# Patient Record
Sex: Female | Born: 1980 | Race: White | Hispanic: No | Marital: Married | Smoking: Current every day smoker
Health system: Southern US, Community
[De-identification: ages and names within clinical notes are randomized; demographics above are authoritative.]

## PROBLEM LIST (undated history)

## (undated) DIAGNOSIS — F4310 Post-traumatic stress disorder, unspecified: Secondary | ICD-10-CM

## (undated) DIAGNOSIS — M542 Cervicalgia: Secondary | ICD-10-CM

## (undated) DIAGNOSIS — R000 Tachycardia, unspecified: Secondary | ICD-10-CM

## (undated) DIAGNOSIS — I10 Essential (primary) hypertension: Secondary | ICD-10-CM

## (undated) DIAGNOSIS — K589 Irritable bowel syndrome without diarrhea: Secondary | ICD-10-CM

## (undated) DIAGNOSIS — F329 Major depressive disorder, single episode, unspecified: Secondary | ICD-10-CM

## (undated) DIAGNOSIS — G43909 Migraine, unspecified, not intractable, without status migrainosus: Secondary | ICD-10-CM

## (undated) DIAGNOSIS — K219 Gastro-esophageal reflux disease without esophagitis: Secondary | ICD-10-CM

## (undated) DIAGNOSIS — L309 Dermatitis, unspecified: Secondary | ICD-10-CM

## (undated) DIAGNOSIS — J020 Streptococcal pharyngitis: Secondary | ICD-10-CM

## (undated) DIAGNOSIS — Z889 Allergy status to unspecified drugs, medicaments and biological substances status: Secondary | ICD-10-CM

## (undated) DIAGNOSIS — R112 Nausea with vomiting, unspecified: Secondary | ICD-10-CM

## (undated) DIAGNOSIS — F419 Anxiety disorder, unspecified: Secondary | ICD-10-CM

## (undated) DIAGNOSIS — Z9889 Other specified postprocedural states: Secondary | ICD-10-CM

## (undated) DIAGNOSIS — F32A Depression, unspecified: Secondary | ICD-10-CM

## (undated) DIAGNOSIS — G932 Benign intracranial hypertension: Secondary | ICD-10-CM

## (undated) DIAGNOSIS — M1990 Unspecified osteoarthritis, unspecified site: Secondary | ICD-10-CM

## (undated) DIAGNOSIS — J329 Chronic sinusitis, unspecified: Secondary | ICD-10-CM

## (undated) DIAGNOSIS — R51 Headache: Secondary | ICD-10-CM

## (undated) DIAGNOSIS — I499 Cardiac arrhythmia, unspecified: Secondary | ICD-10-CM

## (undated) HISTORY — DX: Gastro-esophageal reflux disease without esophagitis: K21.9

## (undated) HISTORY — DX: Major depressive disorder, single episode, unspecified: F32.9

## (undated) HISTORY — DX: Post-traumatic stress disorder, unspecified: F43.10

## (undated) HISTORY — DX: Dermatitis, unspecified: L30.9

## (undated) HISTORY — DX: Migraine, unspecified, not intractable, without status migrainosus: G43.909

## (undated) HISTORY — DX: Irritable bowel syndrome without diarrhea: K58.9

## (undated) HISTORY — DX: Cervicalgia: M54.2

## (undated) HISTORY — DX: Essential (primary) hypertension: I10

## (undated) HISTORY — DX: Tachycardia, unspecified: R00.0

## (undated) HISTORY — PX: CHOLECYSTECTOMY: SHX55

## (undated) HISTORY — DX: Irritable bowel syndrome, unspecified: K58.9

## (undated) HISTORY — PX: WISDOM TOOTH EXTRACTION: SHX21

## (undated) HISTORY — PX: NO PAST SURGERIES: SHX2092

---
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
## 1999-07-26 ENCOUNTER — Inpatient Hospital Stay (HOSPITAL_COMMUNITY): Admission: AD | Admit: 1999-07-26 | Discharge: 1999-07-27 | Payer: Self-pay | Admitting: *Deleted

## 1999-07-26 ENCOUNTER — Encounter (INDEPENDENT_AMBULATORY_CARE_PROVIDER_SITE_OTHER): Payer: Self-pay | Admitting: Specialist

## 1999-07-26 ENCOUNTER — Encounter: Payer: Self-pay | Admitting: *Deleted

## 1999-07-27 HISTORY — PX: OTHER SURGICAL HISTORY: SHX169

## 1999-08-12 ENCOUNTER — Other Ambulatory Visit: Admission: RE | Admit: 1999-08-12 | Discharge: 1999-08-12 | Payer: Self-pay | Admitting: Obstetrics and Gynecology

## 2001-05-12 ENCOUNTER — Other Ambulatory Visit: Admission: RE | Admit: 2001-05-12 | Discharge: 2001-05-12 | Payer: Self-pay | Admitting: Obstetrics and Gynecology

## 2002-08-16 ENCOUNTER — Other Ambulatory Visit: Admission: RE | Admit: 2002-08-16 | Discharge: 2002-08-16 | Payer: Self-pay | Admitting: Obstetrics and Gynecology

## 2003-01-27 ENCOUNTER — Emergency Department (HOSPITAL_COMMUNITY): Admission: EM | Admit: 2003-01-27 | Discharge: 2003-01-27 | Payer: Self-pay | Admitting: Emergency Medicine

## 2003-01-30 ENCOUNTER — Ambulatory Visit (HOSPITAL_COMMUNITY): Admission: RE | Admit: 2003-01-30 | Discharge: 2003-01-30 | Payer: Self-pay | Admitting: Emergency Medicine

## 2003-01-30 ENCOUNTER — Encounter: Payer: Self-pay | Admitting: Emergency Medicine

## 2003-01-31 HISTORY — PX: CHOLECYSTECTOMY: SHX55

## 2003-02-27 ENCOUNTER — Encounter (INDEPENDENT_AMBULATORY_CARE_PROVIDER_SITE_OTHER): Payer: Self-pay | Admitting: *Deleted

## 2003-02-27 ENCOUNTER — Encounter: Payer: Self-pay | Admitting: General Surgery

## 2003-02-27 ENCOUNTER — Ambulatory Visit (HOSPITAL_COMMUNITY): Admission: RE | Admit: 2003-02-27 | Discharge: 2003-02-28 | Payer: Self-pay | Admitting: General Surgery

## 2003-03-01 ENCOUNTER — Encounter: Payer: Self-pay | Admitting: General Surgery

## 2003-03-01 ENCOUNTER — Inpatient Hospital Stay (HOSPITAL_COMMUNITY): Admission: EM | Admit: 2003-03-01 | Discharge: 2003-03-06 | Payer: Self-pay | Admitting: *Deleted

## 2003-03-04 ENCOUNTER — Encounter: Payer: Self-pay | Admitting: General Surgery

## 2003-09-18 ENCOUNTER — Other Ambulatory Visit: Admission: RE | Admit: 2003-09-18 | Discharge: 2003-09-18 | Payer: Self-pay | Admitting: Obstetrics and Gynecology

## 2004-05-14 ENCOUNTER — Ambulatory Visit (HOSPITAL_COMMUNITY): Admission: RE | Admit: 2004-05-14 | Discharge: 2004-05-14 | Payer: Self-pay | Admitting: Family Medicine

## 2004-05-14 ENCOUNTER — Emergency Department (HOSPITAL_COMMUNITY): Admission: EM | Admit: 2004-05-14 | Discharge: 2004-05-14 | Payer: Self-pay | Admitting: Family Medicine

## 2004-06-09 ENCOUNTER — Encounter: Admission: RE | Admit: 2004-06-09 | Discharge: 2004-06-09 | Payer: Self-pay | Admitting: Gastroenterology

## 2004-11-16 ENCOUNTER — Emergency Department (HOSPITAL_COMMUNITY): Admission: EM | Admit: 2004-11-16 | Discharge: 2004-11-16 | Payer: Self-pay | Admitting: Family Medicine

## 2005-01-24 ENCOUNTER — Encounter: Admission: RE | Admit: 2005-01-24 | Discharge: 2005-01-24 | Payer: Self-pay | Admitting: Neurology

## 2006-10-12 ENCOUNTER — Emergency Department (HOSPITAL_COMMUNITY): Admission: EM | Admit: 2006-10-12 | Discharge: 2006-10-12 | Payer: Self-pay | Admitting: Emergency Medicine

## 2008-03-14 ENCOUNTER — Emergency Department (HOSPITAL_COMMUNITY): Admission: EM | Admit: 2008-03-14 | Discharge: 2008-03-14 | Payer: Self-pay | Admitting: Family Medicine

## 2008-05-09 ENCOUNTER — Emergency Department (HOSPITAL_COMMUNITY): Admission: EM | Admit: 2008-05-09 | Discharge: 2008-05-09 | Payer: Self-pay | Admitting: Family Medicine

## 2009-07-10 ENCOUNTER — Emergency Department (HOSPITAL_COMMUNITY): Admission: EM | Admit: 2009-07-10 | Discharge: 2009-07-10 | Payer: Self-pay | Admitting: Family Medicine

## 2009-07-21 ENCOUNTER — Emergency Department (HOSPITAL_COMMUNITY): Admission: EM | Admit: 2009-07-21 | Discharge: 2009-07-21 | Payer: Self-pay | Admitting: Emergency Medicine

## 2010-07-12 ENCOUNTER — Inpatient Hospital Stay (HOSPITAL_COMMUNITY)
Admission: AD | Admit: 2010-07-12 | Discharge: 2010-07-13 | Disposition: A | Discharge status: Home / Self Care | Payer: BC Managed Care – PPO | Source: Ambulatory Visit | Attending: Obstetrics | Admitting: Obstetrics

## 2010-07-12 DIAGNOSIS — R5082 Postprocedural fever: Secondary | ICD-10-CM | POA: Insufficient documentation

## 2010-07-13 LAB — CBC
HCT: 41 % (ref 36.0–46.0)
RBC: 4.48 MIL/uL (ref 3.87–5.11)

## 2010-07-13 LAB — DIFFERENTIAL
Basophils Absolute: 0 10*3/uL (ref 0.0–0.1)
Eosinophils Absolute: 0.2 10*3/uL (ref 0.0–0.7)
Eosinophils Relative: 3 % (ref 0–5)
Metamyelocytes Relative: 0 %
Monocytes Relative: 11 % (ref 3–12)
Myelocytes: 0 %
Neutrophils Relative %: 69 % (ref 43–77)

## 2010-08-22 LAB — STREP A DNA PROBE: Group A Strep Probe: NEGATIVE

## 2010-10-17 NOTE — Op Note (Signed)
[facility]  Patient:    [name], [name]                       MRN: [id number] Proc. Date: 07/27/99 Adm. Date:  [id number] Attending:  Ardeen Fillers                           Operative Report  PREDELIVERY DIAGNOSIS:        Fetal demise.  POST DELIVERY DIAGNOSES:      Fetal demise, moderate shoulder dystocia.  PROCEDURE:                    Low vacuum-assisted vaginal delivery, suprapubic pressure and McRoberts maneuver and Woods screw maneuver.  SURGEON:                      Lenoard Aden, M.D.  INDICATIONS:                  Fetal demise with maternal request for operative delivery.  DESCRIPTION OF PROCEDURE:     After achieving appropriate consent, basically focusing on potential maternal injury from vacuum placed assistance.  The patient desires vacuum assistance after being offered by the physician in attendance. At this time, her bladder having been emptied, the Mityvac mushroom cup is placed ith the head at +3 station and direct OA position.  Three pulls are needed for delivery of the fetal vertex over a central median episiotomy.  Upon delivery of the fetal vertex, moderate shoulder dystocia is encountered.  McRoberts maneuver and suprapubic pressure is applied with minimal expulsion of the anterior shoulder. At this time [facility] screw maneuver is performed rotating the anterior shoulder posterior and with Woods screw maneuver the fetus which is a slightly macerated  female fetus, approximately 8 pounds is delivered without complications.  Cord s clamped.  Mother and father choose to hold the baby.  Placenta is delivered spontaneously intact and sent to pathology.  No lacerations are noted.  Midline  episiotomy is repaired with 3-0 Monocryl.  The cervix is inspected and found to be without lacerations  Good hemostasis is achieved.  Estimated blood loss 500 cc.  The patient tolerated the procedure well and is grieving  appropriately. DD:  07/27/99 TD:  07/27/99 Job: [id number]

## 2010-10-17 NOTE — H&P (Signed)
Jessica, Weber                          ACCOUNT NO.:  [id number]   MEDICAL RECORD NO.:  [id number]                   PATIENT TYPE:  EMS   LOCATION:  MAJO                                 FACILITY:  [facility]   PHYSICIAN:  Ollen Gross. Vernell Morgans, M.D.              DATE OF BIRTH:  03-02-1981   DATE OF ADMISSION:  03/01/2003  DATE OF DISCHARGE:                                HISTORY & PHYSICAL   [name] is a 30 year old white female who underwent laparoscopic  cholecystectomy yesterday by Dr. Abbey Chatters.  She did well, was discharged  earlier this morning.  She initially did well after returning home but then  developed some periumbilical pain.  This was associated with nausea and  vomiting.  The pain was severe enough to bring her back to the emergency  department tonight.  She states she has not been able to keep anything down.  The pain is localized to her periumbilical region.  It does not seem to  radiate anywhere.   REVIEW OF SYSTEMS:  The rest of her review of systems is unremarkable.   PAST MEDICAL HISTORY:  Significant for:  1. Reflux.  2. Gallbladder polyposis.  3. Anxiety.   PAST SURGICAL HISTORY:  Significant for the laparoscopic cholecystectomy  yesterday with intraoperative cholangiogram.   MEDICATIONS:  Protonix, birth control pills and Xanax.   ALLERGIES:  VICODIN.   SOCIAL HISTORY:  She denies the use of alcohol, tobacco products.   FAMILY HISTORY:  Noncontributory.   PHYSICAL EXAMINATION:  GENERAL:  She is a well developed, well nourished,  young white female in no acute distress.  SKIN:  Warm and dry with no jaundice.  EYES:  Her extraocular muscles are intact.  Pupils equal, round and reactive  to light.  Sclera nonicteric.  LUNGS:  Clear bilaterally with no use of accessory respiratory muscles.  HEART:  Regular, rate and rhythm with an ___________ left chest.  ABDOMEN:  Soft.  She has some peri-incisional pain but no abdominal guarding  or peritonitis.   I could not palpate any masses.  Her incisions are clean  and intact.  EXTREMITIES:  No clubbing, cyanosis or edema.  PSYCHOLOGIC:  She is alert and oriented  x 3 with no evidence today of  anxiety or depression.   Some lab work was obtained.  Her sodium is 136, potassium 3.9, chloride 105,  CO2 26, BUN 3, creatinine 0.7, glucose 123.  Her total bili was 0.3, alk  phos 56, SGOT 40, SGPT 47.  Her white count is 13.400, hemoglobin 13,  hematocrit 38.5, platelet count 241,000.   Right upper quadrant ultrasound is pending.   ASSESSMENT/PLAN:  This is a 30 year old white female about one and a half  days status post laparoscopic cholecystectomy with acute onset of nausea and  vomiting.  She does not have signs of peritonitis.  We will re-admit her for  pain control.  We will obtain a right upper quadrant ultrasound to look for  any free fluid suggestive of a possible biloma at the operative site.  I  will contact Dr. Abbey Chatters and let him know that she is being re-admitted.                                                Ollen Gross. Vernell Morgans, M.D.    PST/MEDQ  D:  03/01/2003  T:  03/01/2003  Job:  [id number]

## 2010-10-17 NOTE — Discharge Summary (Signed)
[facility]  Patient:    [name], [name]                       MRN: [id number] Adm. Date:  07/26/99 Disc. Date: 07/27/99 Attending:  Genia Del                           Discharge Summary  CHIEF COMPLAINT:              Decreased fetal movement.  HISTORY OF PRESENT ILLNESS:   The patient is a 30 year old white female, gravida 1, para 0, EDD August 18, 1999, who presented to maternity admissions unit on the afternoon of July 26, 1999, with reports of no fetal movement for the past 5 hours with a fetal demise in utero.  HOSPITAL COURSE:              The patient was admitted and underwent appropriate counseling.  She underwent induction with vacuum-assisted vaginal delivery. Operative note dictated for delivery of a stillborn female, Apgars 0 and 0 on July 27, 1999.  Postpartum course was uncomplicated.  She was discharged to  home with Ambien, Tylox, and Motrin.  She was seen by social work prior to delivery.  The patient was coping well and had a good family support system.  FOLLOW-UP:                    She was to follow up in the office in one week. Discharge teaching is done. DD:  09/29/99 TD:  10/01/99 Job: [id number]

## 2010-10-17 NOTE — Discharge Summary (Signed)
NAMEGENEVE, KIMPEL                          ACCOUNT NO.:  [id number]   MEDICAL RECORD NO.:  [id number]                   PATIENT TYPE:  INP   LOCATION:  5703                                 FACILITY:  [facility]   PHYSICIAN:  Adolph Pollack, M.D.            DATE OF BIRTH:  [birth year]   DATE OF ADMISSION:  03/01/2003  DATE OF DISCHARGE:  03/06/2003                                 DISCHARGE SUMMARY   PRINCIPAL DISCHARGE DIAGNOSES:  1. Post cholecystectomy.  2. Abdominal pain with nausea and vomiting.   SECONDARY DIAGNOSES:  1. Gastroesophageal reflux.  2. Anxiety.   PROCEDURES:  None.   REASON FOR ADMISSION:  This 30 year old female underwent a laparoscopic  cholecystectomy on February 28, 2003.  She did well, was discharged early  in the morning, and actually did well until late the evening of discharge  when she began having some periumbilical pain associated with nausea and  vomiting.  She was seen by Dr. Carolynne Edouard and readmitted.  She is noted to have a  white blood cell count of 13,400 with mild elevation of SGOT and SGPT.   HOSPITAL COURSE:  Right upper quadrant ultrasound was performed, and a very  small fluid collection was seen in the gallbladder fossa consistent with  postoperative changes.  Hepatobiliary scan was also done.  No evidence of  bile leak was noted.  CT scan was performed which demonstrated a very small  amount of intraperitoneal air and fluid consistent with postoperative  changes.  She was started on empiric antibiotics.  She slowly improved.  Her  pain went away fairly quickly, but the nausea was somewhat persistent.  A  two view x-ray of the abdomen demonstrated no obstructive pattern.  Her  white blood cell count and liver functions normalized.  She subsequently had  her IV heplocked and we started her on oral Reglan.  Her bowels were moving.  With the Reglan she was much better.  She tolerated her diet, had no nausea  or vomiting.  By March 06, 2003,  was asking to go home.   DISPOSITION:  Discharged to home on March 06, 2003, in satisfactory  condition.  She will continue the previous discharge instructions I gave  her.  I have her Reglan 10 mg p.o. q.i.d.  I did explain the side effects of  tardive dyskinesia and what to do should she get that side effect.  We will  do the Reglan for only a week.  I will see her back in the office in  approximately eight days.                                                Adolph Pollack, M.D.    Kari Baars  D:  03/06/2003  T:  03/06/2003  Job:  [id number]

## 2010-10-17 NOTE — Op Note (Signed)
NAMECHARLEE, SQUIBB                          ACCOUNT NO.:  [id number]   MEDICAL RECORD NO.:  [id number]                   PATIENT TYPE:  OIB   LOCATION:  2550                                 FACILITY:  [facility]   PHYSICIAN:  Adolph Pollack, M.D.            DATE OF BIRTH:  1981/01/08   DATE OF PROCEDURE:  02/27/2003  DATE OF DISCHARGE:                                 OPERATIVE REPORT   PREOPERATIVE DIAGNOSIS:  Gallbladder polyps.   POSTOPERATIVE DIAGNOSIS:  Gallbladder polyps.   PROCEDURE:  Laparoscopic cholecystectomy with intraoperative cholangiogram.   SURGEON:  Adolph Pollack, M.D.   ASSISTANT:  Anselm Pancoast. Zachery Dakins, M.D.   ANESTHESIA:  General.   INDICATIONS FOR PROCEDURE:  The patient is a 30 year old female who has been  having some postprandial nausea and discomfort in the epigastric region.  She was evaluated up at the emergency department where liver function tests,  amylase, and lipase were normal, as well as a white blood cell count was  normal.  An abdominal ultrasound demonstrated multiple non-mobile polypoid  lesions consistent with gallbladder polyposis.  The common bile duct was  normal in diameter.  For the reason of the multiple gallbladder polyps, she  is brought to the operating room.  I told her it may also help her pain, but  there was no guaranty of this.  We did go over the procedure and the risks.   DESCRIPTION OF PROCEDURE:  She is seen in the holding area, and then brought  to the operating room and placed supine on the operating room table.  A  general anesthetic was administered.  The abdominal wall was sterilely  prepped and draped.  A local anesthetic consisting of 0.5% Marcaine was  infiltrated in the subumbilical region, and a small subumbilical scar was  made through the skin and subcutaneous tissue.  The midline fascia was  identified and a small incision was made in it.  A small incision was made  in the peritoneum under direct  vision, and the peritoneal cavity was  entered.  A pursestring suture of 0 Vicryl was placed around the fascial  edges.  A Hasson trocar was introduced into the peritoneal cavity, and a  pneumoperitoneum was created by insufflation of CO2 gas.   The laparoscope was then introduced and then she was placed into the reverse  Trendelenburg position with the right side tilted slightly upward.  Under  direct vision a 10 mm trocar was placed through an epigastric incision, and  two 5 mm trocars were placed through right mid-lateral abdominal incisions.  The fundus was grasped, and filmy adhesions between the omentum and the  gallbladder were taken down bluntly and sharply.  The fundus was then  retracted toward the right shoulder, and the infundibulum was grasped and  mobilized using select cautery and blunt dissection.  I then isolated the  cystic duct and created a window around  it.  A clip was placed above the  cystic duct/gallbladder junction, and a small incision made at the cystic  duct/gallbladder junction.  A cholangio-catheter was placed through the  anterior abdominal wall into the cystic duct, and a cholangiogram was  performed.   Under real time fluoroscopy dilute contrast material was injected into the  cystic duct.  The cystic duct was long.  The common hepatic, right and left  hepatic, and common bile ducts all opacified promptly, and the contrast  material drained promptly from the common bile duct into the duodenum  without obvious evidence of obstruction.  The final report is pending the  radiologist's interpretation.   The cholangio-catheter was removed, and the cystic duct was then clipped  three times, staying inside, and divided.  The cystic artery was identified.  A window created around it.  It was then clipped and divided.  The  gallbladder was then dissected free from the liver bed with the cautery.  A  small puncture wound was made in the gallbladder with a small  amount of bile  leakage.  Once the gallbladder was removed, it was placed in an Endo Pouch  bag.   I then irrigated out the gallbladder fossa, and bleeding points were  controlled with the cautery.  I irrigated the perihepatic area until the  fluid returned clear.  I inspected the gallbladder fossa once again, and no  bleeding was noted.  No bile leak was noted.  The gallbladder was then  removed in the Endo Pouch bag through the subumbilical port, and under  laparoscopic vision the subumbilical fascia defect was closed by tightening  up and tightening down the pursestring suture.  The remaining trocars were  removed, and a pneumoperitoneum was released.   The skin incisions were then closed with #4-0 Monocryl subcuticular  stitches.  Steri-Strips and sterile dressings were applied.  She tolerated  the procedure well without any apparent complications, and was taken to the  recovery room in satisfactory condition.                                                Adolph Pollack, M.D.    Kari Baars  D:  02/27/2003  T:  02/27/2003  Job:  [id number]

## 2010-12-31 LAB — ANTIBODY SCREEN: Antibody Screen: NEGATIVE

## 2010-12-31 LAB — HIV ANTIBODY (ROUTINE TESTING W REFLEX): HIV: NONREACTIVE

## 2011-03-03 LAB — POCT RAPID STREP A: Streptococcus, Group A Screen (Direct): NEGATIVE

## 2011-03-06 LAB — POCT RAPID STREP A: Streptococcus, Group A Screen (Direct): NEGATIVE

## 2011-04-07 ENCOUNTER — Inpatient Hospital Stay (HOSPITAL_COMMUNITY)
Admission: AD | Admit: 2011-04-07 | Discharge: 2011-04-07 | Disposition: A | Discharge status: Home / Self Care | Payer: Medicaid Other | Source: Ambulatory Visit | Attending: Obstetrics and Gynecology | Admitting: Obstetrics and Gynecology

## 2011-04-07 ENCOUNTER — Encounter (HOSPITAL_COMMUNITY): Payer: Self-pay | Admitting: *Deleted

## 2011-04-07 DIAGNOSIS — B373 Candidiasis of vulva and vagina: Secondary | ICD-10-CM | POA: Insufficient documentation

## 2011-04-07 DIAGNOSIS — O99891 Other specified diseases and conditions complicating pregnancy: Secondary | ICD-10-CM | POA: Insufficient documentation

## 2011-04-07 DIAGNOSIS — M545 Low back pain, unspecified: Secondary | ICD-10-CM | POA: Insufficient documentation

## 2011-04-07 DIAGNOSIS — O2390 Unspecified genitourinary tract infection in pregnancy, unspecified trimester: Secondary | ICD-10-CM | POA: Insufficient documentation

## 2011-04-07 DIAGNOSIS — R030 Elevated blood-pressure reading, without diagnosis of hypertension: Secondary | ICD-10-CM | POA: Insufficient documentation

## 2011-04-07 DIAGNOSIS — B3731 Acute candidiasis of vulva and vagina: Secondary | ICD-10-CM | POA: Insufficient documentation

## 2011-04-07 LAB — COMPREHENSIVE METABOLIC PANEL
BUN: 8 mg/dL (ref 6–23)
CO2: 23 mEq/L (ref 19–32)
Calcium: 9.2 mg/dL (ref 8.4–10.5)
Chloride: 102 mEq/L (ref 96–112)
Creatinine, Ser: 0.48 mg/dL — ABNORMAL LOW (ref 0.50–1.10)
GFR calc Af Amer: >90 mL/min (ref >90)
GFR calc non Af Amer: >90 mL/min (ref >90)
Total Bilirubin: 0.2 mg/dL — ABNORMAL LOW (ref 0.3–1.2)

## 2011-04-07 LAB — URINALYSIS, ROUTINE W REFLEX MICROSCOPIC
Bilirubin Urine: NEGATIVE
Hgb urine dipstick: NEGATIVE
Ketones, ur: NEGATIVE mg/dL
Nitrite: NEGATIVE
Specific Gravity, Urine: <1.005 — ABNORMAL LOW (ref 1.005–1.030)
Urobilinogen, UA: 0.2 mg/dL (ref 0.0–1.0)
pH: 5.5 (ref 5.0–8.0)

## 2011-04-07 LAB — URIC ACID: Uric Acid, Serum: 3.4 mg/dL (ref 2.4–7.0)

## 2011-04-07 LAB — CBC
HCT: 34.8 % — ABNORMAL LOW (ref 36.0–46.0)
MCH: 31.1 pg (ref 26.0–34.0)
MCV: 91.6 fL (ref 78.0–100.0)
RBC: 3.8 MIL/uL — ABNORMAL LOW (ref 3.87–5.11)
WBC: 16.5 10*3/uL — ABNORMAL HIGH (ref 4.0–10.5)

## 2011-04-07 MED ORDER — TERCONAZOLE 0.4 % VA CREA: 1.0000 | TOPICAL_CREAM | Freq: Every day | VAGINAL | Status: AC

## 2011-04-07 NOTE — Progress Notes (Signed)
Pt states she is feeling cramping and that her B/P was up at home-states it was 140/92

## 2011-04-07 NOTE — Progress Notes (Signed)
Pt states she checked her B/P at home because she had "mild pitting edema" . G2P1 stillborn. Denies headache or blurred vision.

## 2011-04-07 NOTE — H&P (Signed)
Chief complaint: High blood pressure at home, pitting edema, low back pain  History of present illness: This is a 30 year old G2 P 1000 with history of pseudotumor cerebri and prior term IUFD back in 2001 with shoulder dystocia at delivery. Patient also suffers from depression and anxiety. Patient has no history of hypertension or diabetes. The patient notes poor sleep and early this a.m. patient noted a slight pitting edema which triggered her to check her blood pressure she reports a blood pressure of 140/90 and decided to come in for evaluation. Patient notes no headache, no vision change. Patient states good fetal movement no vaginal bleeding no vaginal discharge. Patient notes no contractions but does note she's had menstrual type low back cramps since yesterday. These occur occasionally. Patient states nothing per vagina for the past 24 hours. The patient notes no vaginal discharge, no vaginal itching, but "my vagina has been more sore lately. "Patient notes she's had alternating diarrhea and constipation over the past few weeks. No blood in her stool. No dysuria. No blood in her urine.  Prenatal issues: Prenatal care at [facility] with Dr. Billy Coast as primary - History of pseudotumor cerebri for which she needed a spinal tap at 11 weeks. Patient has been instructed to take Lasix for this although she has been noncompliant with this medication. The patient notes her last dose of Lasix was approximately one week ago. - Depression, anxiety. On meds for this has been well-controlled - Poor sleep, no meds - History of term IUFD and sold her dystocia. Planning primary cesarean section - h/o LEEP  Past medical history pseudotumor cerebri, depression, prior LEEP  PE: Filed Vitals:   04/07/11 0358 04/07/11 0413  BP: 133/67 129/77  Pulse: 127 108  Temp: 98.2 F (36.8 C)   Resp: 18 20  Height: 5\' 5"  (1.651 m)   Weight: 99.338 kg (219 lb)   SpO2: 97% 96%    General: Well-appearing no  distress Back: No costovertebral angle tenderness Abdomen: Gravid, obese, no fundal tenderness, no right upper quadrant pain Cardiovascular: Regular rate and rhythm Pulmonary: Lungs clear to auscultation anteriorly GU: Moderate amount of creamy thick white vaginal discharge, no clear fluid, cervix closed, 2-1/2 cm in length palpating on the anterior wall of the cervix Lower extremities: No edema noted, no clonus, and DTRs 2+ Toco irritability noted FH: 150s 10 beat variability +10 x 10 accells, appropriate for gestational age  Wet prep: Moderate amount of white blood cells, positive yeast, numerous lactobacilli, rare clue cells  Assessment and plan: 30 year old G2P1000 at 25 weeks who presents with back pain, report of hypertension and edema.  Back pain: Likely normal variant given gestational age. No evidence of pyelonephritis, no evidence for preterm labor. Given your debility on tocometer and history of LEEP Will check fetal fibronectin now  Report of hypertension and edema: Neither of these seen on exam now blood pressure stable and exam negative for preeclampsia. Given patient's obesity as a risk for preeclampsia and her report of symptoms Will check PIH labs now  Fetal well being. NST appropriate for gestational age.  Yeast infection recommend treatment with Terazol #7  Dispo- if labs nl will d/c home for routine office f/u   Murray Durrell A. 04/07/2011 4:48 AM

## 2011-06-18 ENCOUNTER — Encounter (HOSPITAL_COMMUNITY): Payer: Self-pay

## 2011-06-18 ENCOUNTER — Other Ambulatory Visit: Payer: Self-pay | Admitting: Obstetrics and Gynecology

## 2011-06-19 ENCOUNTER — Encounter (HOSPITAL_COMMUNITY): Payer: Self-pay

## 2011-06-19 ENCOUNTER — Encounter (HOSPITAL_COMMUNITY)
Admission: RE | Admit: 2011-06-19 | Discharge: 2011-06-19 | Disposition: A | Discharge status: Home / Self Care | Payer: Medicaid Other | Source: Ambulatory Visit | Attending: Obstetrics and Gynecology | Admitting: Obstetrics and Gynecology

## 2011-06-19 ENCOUNTER — Other Ambulatory Visit: Payer: Self-pay

## 2011-06-19 HISTORY — DX: Other specified postprocedural states: R11.2

## 2011-06-19 HISTORY — DX: Headache: R51

## 2011-06-19 HISTORY — DX: Anxiety disorder, unspecified: F41.9

## 2011-06-19 HISTORY — DX: Allergy status to unspecified drugs, medicaments and biological substances: Z88.9

## 2011-06-19 HISTORY — DX: Nausea with vomiting, unspecified: Z98.890

## 2011-06-19 HISTORY — DX: Unspecified osteoarthritis, unspecified site: M19.90

## 2011-06-19 HISTORY — DX: Cardiac arrhythmia, unspecified: I49.9

## 2011-06-19 HISTORY — DX: Major depressive disorder, single episode, unspecified: F32.9

## 2011-06-19 HISTORY — DX: Gastro-esophageal reflux disease without esophagitis: K21.9

## 2011-06-19 HISTORY — DX: Depression, unspecified: F32.A

## 2011-06-19 HISTORY — DX: Benign intracranial hypertension: G93.2

## 2011-06-19 LAB — COMPREHENSIVE METABOLIC PANEL
ALT: 42 U/L — ABNORMAL HIGH (ref 0–35)
AST: 21 U/L (ref 0–37)
CO2: 25 mEq/L (ref 19–32)
Calcium: 9.4 mg/dL (ref 8.4–10.5)
Chloride: 103 mEq/L (ref 96–112)
GFR calc non Af Amer: >90 mL/min (ref >90)
Sodium: 136 mEq/L (ref 135–145)

## 2011-06-19 LAB — SURGICAL PCR SCREEN
MRSA, PCR: NEGATIVE
Staphylococcus aureus: POSITIVE — AB

## 2011-06-19 LAB — CBC
MCH: 30.5 pg (ref 26.0–34.0)
Platelets: 252 10*3/uL (ref 150–400)
RBC: 4.23 MIL/uL (ref 3.87–5.11)
WBC: 12.9 10*3/uL — ABNORMAL HIGH (ref 4.0–10.5)

## 2011-06-19 MED ORDER — CEFAZOLIN SODIUM-DEXTROSE 2-3 GM-% IV SOLR
2.0000 g | INTRAVENOUS | Status: AC
  Administered 2011-06-20: 2 g via INTRAVENOUS
  Filled 2011-06-19: qty 50

## 2011-06-19 NOTE — Patient Instructions (Addendum)
   Your procedure is scheduled on: Saturday, Jan 19th  Enter through the [employer] of [facility] at: 7am Pick up the phone at the desk and dial [phone] and inform us of your arrival.  Please call this number if you have any problems the morning of surgery: [phone]  Remember: Do not eat food after midnight: Friday Do not drink clear liquids after: Friday Take these medicines the morning of surgery with a SIP OF WATER: None - patient takes all meds at night.  Do not wear jewelry, make-up, or FINGER nail polish Do not wear lotions, powders, perfumes or deodorant. Do not shave 48 hours prior to surgery. Do not bring valuables to the hospital.  Leave suitcase in the car. After Surgery it may be brought to your room. For patients being admitted to the hospital, checkout time is 11:00am the day of discharge.  Home with Boyfriend Vittoria Noreen  cell [phone]  Remember to use your hibiclens as instructed.Please shower with 1/2 bottle the evening before your surgery and the other 1/2 bottle the morning of surgery.

## 2011-06-19 NOTE — H&P (Signed)
[name], [name]                ACCOUNT NO.:  [id number]  MEDICAL RECORD NO.:  [id number]  LOCATION:  PERIO                         FACILITY:  [facility]  PHYSICIAN:  Lenoard Aden, M.D.DATE OF BIRTH:  Dec 03, 1980  DATE OF ADMISSION:  06/18/2011 DATE OF DISCHARGE:                             HISTORY & PHYSICAL   CHIEF COMPLAINT:  Primary C-section.  HISTORY OF PRESENT ILLNESS:  She is a 31 year old white female, G2, P0, at 56 and 2/7th weeks' gestation, who presents for primary C-section.  PAST MEDICAL HISTORY:  Remarkable for an unexplained fetal demise in 2001 of a 7 pounds 8 ounce fetus that was unexplained at 36 weeks.  Due to her history of a previous unexplained 3rd trimester fetal demise, the patient's decision and management plan was discussed with Maternal Fetal Medicine, and concerns regarding early delivery.  In addition to this, the patient has a history of pseudotumor cerebri with labile blood pressures.  She has a history of new onset moderate polyhydramnios in this pregnancy with an AFI ranging in the 30-34 range, previous fetal demise.  Vaginal delivery was complicated by a severe shoulder dystocia, and therefore root of delivery per discussion with the patient and her husband, wish to proceed with primary C-section.  This management plan has been approved by Maternal Fetal Medicine and Dr. Sherrie George.  PAST SURGICAL HISTORY:  Remarkable for a LEEP in 2012 and a cholecystectomy.  ALLERGIES:  She has no known drug allergies.  MEDICATIONS:  Celexa, concept DHA prenatal vitamin, and Benadryl as needed.  PRENATAL COURSE:  Prenatal course has been complicated by labile blood pressures.  Early pregnancy symptomatology related to her pseudotumor cerebri.  Briefly, placed on diuretics and discontinued.  Increased anxiety and depression  situational on nature and size dates discrepancy related to larger gestational age fetus and moderate polyhydramnios  FAMILY HISTORY:   Remarkable for COPD, stroke, heart disease, diabetes, and hypertension.  PHYSICAL EXAMINATION:  GENERAL:  She is a well-developed, well-nourished white female, in no acute distress. VITAL SIGNS:  Weight of 232 pounds, 65 inches in height. HEENT:  Normal. NECK:  Supple.  Full range of motion. LUNGS:  Clear. HEART:  Regular rate and rhythm. ABDOMEN:  Soft, gravid, nontender.  Estimated fetal weight 8 pounds. Cervix is 1 cm, 60%, vertex -2. EXTREMITIES:  No cords. NEUROLOGIC:  Nonfocal. SKIN:  Intact.  IMPRESSION: 1. A 36 and 2/7 weeks' intrauterine pregnancy with a history of third     trimester fetal demise and moderate polyhydramnios.  The patient is     status post mature amniocentesis with an LS ratio of 3.3 and PG     positive. 2.  Previous history of severe shoulder dystocia.  PLAN:  Proceed with primary low segment transverse cesarean section. Risks of anesthesia, infection, bleeding, injury to abdominal organs, and need for repair was discussed, delayed versus immediate complications to include bowel, bladder injury noted, possible risks of fetal immaturity due to early term delivery are noted.  This management plan was confirmed with Maternal Fetal Medicine.     Lenoard Aden, M.D.     RJT/MEDQ  D:  06/19/2011  T:  06/19/2011  Job:  [id number]

## 2011-06-20 ENCOUNTER — Encounter (HOSPITAL_COMMUNITY): Payer: Self-pay

## 2011-06-20 ENCOUNTER — Inpatient Hospital Stay (HOSPITAL_COMMUNITY)
Admission: RE | Admit: 2011-06-20 | Discharge: 2011-06-22 | DRG: 765 | Disposition: A | Discharge status: Home / Self Care | Payer: Medicaid Other | Source: Ambulatory Visit | Attending: Obstetrics and Gynecology | Admitting: Obstetrics and Gynecology

## 2011-06-20 ENCOUNTER — Encounter (HOSPITAL_COMMUNITY)
Admission: RE | Disposition: A | Discharge status: Home / Self Care | Payer: Self-pay | Source: Ambulatory Visit | Attending: Obstetrics and Gynecology

## 2011-06-20 ENCOUNTER — Inpatient Hospital Stay (HOSPITAL_COMMUNITY): Payer: Medicaid Other

## 2011-06-20 DIAGNOSIS — O1092 Unspecified pre-existing hypertension complicating childbirth: Secondary | ICD-10-CM | POA: Diagnosis present

## 2011-06-20 DIAGNOSIS — G932 Benign intracranial hypertension: Secondary | ICD-10-CM | POA: Diagnosis present

## 2011-06-20 DIAGNOSIS — O10919 Unspecified pre-existing hypertension complicating pregnancy, unspecified trimester: Secondary | ICD-10-CM | POA: Diagnosis present

## 2011-06-20 DIAGNOSIS — O3660X0 Maternal care for excessive fetal growth, unspecified trimester, not applicable or unspecified: Secondary | ICD-10-CM | POA: Diagnosis present

## 2011-06-20 DIAGNOSIS — O409XX0 Polyhydramnios, unspecified trimester, not applicable or unspecified: Principal | ICD-10-CM | POA: Diagnosis present

## 2011-06-20 LAB — ABO/RH: ABO/RH(D): O POS

## 2011-06-20 SURGERY — Surgical Case
Anesthesia: Spinal | Site: Abdomen | Wound class: Clean Contaminated

## 2011-06-20 MED ORDER — BUPIVACAINE HCL (PF) 0.25 % IJ SOLN
INTRAMUSCULAR | Status: DC | PRN
  Administered 2011-06-20: 10 mL

## 2011-06-20 MED ORDER — ONDANSETRON HCL 4 MG/2ML IJ SOLN: 4.0000 mg | Freq: Three times a day (TID) | INTRAMUSCULAR | Status: DC | PRN

## 2011-06-20 MED ORDER — DIPHENHYDRAMINE HCL 50 MG/ML IJ SOLN: 25.0000 mg | INTRAMUSCULAR | Status: DC | PRN

## 2011-06-20 MED ORDER — SENNOSIDES-DOCUSATE SODIUM 8.6-50 MG PO TABS
2.0000 | ORAL_TABLET | Freq: Every day | ORAL | Status: DC
  Administered 2011-06-20 – 2011-06-21 (×2): 2 via ORAL

## 2011-06-20 MED ORDER — MEPERIDINE HCL 25 MG/ML IJ SOLN: 6.2500 mg | INTRAMUSCULAR | Status: DC | PRN

## 2011-06-20 MED ORDER — WITCH HAZEL-GLYCERIN EX PADS: 1.0000 "application " | MEDICATED_PAD | CUTANEOUS | Status: DC | PRN

## 2011-06-20 MED ORDER — NALOXONE HCL 0.4 MG/ML IJ SOLN: 0.4000 mg | INTRAMUSCULAR | Status: DC | PRN

## 2011-06-20 MED ORDER — CITALOPRAM HYDROBROMIDE 20 MG PO TABS: 20.0000 mg | ORAL_TABLET | Freq: Every day | ORAL | Status: DC

## 2011-06-20 MED ORDER — SIMETHICONE 80 MG PO CHEW: 80.0000 mg | CHEWABLE_TABLET | ORAL | Status: DC | PRN

## 2011-06-20 MED ORDER — LACTATED RINGERS IV SOLN
INTRAVENOUS | Status: DC
  Administered 2011-06-20 (×4): via INTRAVENOUS

## 2011-06-20 MED ORDER — MORPHINE SULFATE (PF) 0.5 MG/ML IJ SOLN
INTRAMUSCULAR | Status: DC | PRN
  Administered 2011-06-20: 200 ug via INTRATHECAL

## 2011-06-20 MED ORDER — NALBUPHINE HCL 10 MG/ML IJ SOLN
5.0000 mg | INTRAMUSCULAR | Status: DC | PRN
  Filled 2011-06-20: qty 1

## 2011-06-20 MED ORDER — LACTATED RINGERS IV SOLN
INTRAVENOUS | Status: DC
Start: 2011-06-20 — End: 2011-06-22
  Administered 2011-06-20: 23:00:00 via INTRAVENOUS

## 2011-06-20 MED ORDER — DIPHENHYDRAMINE HCL 25 MG PO CAPS: 25.0000 mg | ORAL_CAPSULE | Freq: Four times a day (QID) | ORAL | Status: DC | PRN

## 2011-06-20 MED ORDER — KETOROLAC TROMETHAMINE 30 MG/ML IJ SOLN: 30.0000 mg | Freq: Four times a day (QID) | INTRAMUSCULAR | Status: AC | PRN

## 2011-06-20 MED ORDER — OXYTOCIN 10 UNIT/ML IJ SOLN
INTRAMUSCULAR | Status: AC
  Filled 2011-06-20: qty 2

## 2011-06-20 MED ORDER — OXYTOCIN 10 UNIT/ML IJ SOLN
INTRAMUSCULAR | Status: AC
  Filled 2011-06-20: qty 4

## 2011-06-20 MED ORDER — METHYLERGONOVINE MALEATE 0.2 MG PO TABS: 0.2000 mg | ORAL_TABLET | ORAL | Status: DC | PRN

## 2011-06-20 MED ORDER — SODIUM CHLORIDE 0.9 % IJ SOLN: 3.0000 mL | INTRAMUSCULAR | Status: DC | PRN

## 2011-06-20 MED ORDER — FENTANYL CITRATE 0.05 MG/ML IJ SOLN
INTRAMUSCULAR | Status: AC
  Filled 2011-06-20: qty 2

## 2011-06-20 MED ORDER — FENTANYL CITRATE 0.05 MG/ML IJ SOLN
INTRAMUSCULAR | Status: DC | PRN
  Administered 2011-06-20 (×3): 25 ug via INTRAVENOUS

## 2011-06-20 MED ORDER — IBUPROFEN 600 MG PO TABS
600.0000 mg | ORAL_TABLET | Freq: Four times a day (QID) | ORAL | Status: DC | PRN
  Filled 2011-06-20 (×8): qty 1

## 2011-06-20 MED ORDER — DIPHENHYDRAMINE HCL 50 MG/ML IJ SOLN
INTRAMUSCULAR | Status: DC | PRN
  Administered 2011-06-20 (×2): 12.5 mg via INTRAVENOUS

## 2011-06-20 MED ORDER — MUPIROCIN 2 % EX OINT
TOPICAL_OINTMENT | Freq: Two times a day (BID) | CUTANEOUS | Status: DC
  Administered 2011-06-20: 07:00:00 via NASAL

## 2011-06-20 MED ORDER — KETOROLAC TROMETHAMINE 60 MG/2ML IM SOLN
INTRAMUSCULAR | Status: AC
  Administered 2011-06-20: 60 mg via INTRAMUSCULAR
  Filled 2011-06-20: qty 2

## 2011-06-20 MED ORDER — EPHEDRINE SULFATE 50 MG/ML IJ SOLN
INTRAMUSCULAR | Status: DC | PRN
  Administered 2011-06-20 (×3): 10 mg via INTRAVENOUS

## 2011-06-20 MED ORDER — IBUPROFEN 600 MG PO TABS
600.0000 mg | ORAL_TABLET | Freq: Four times a day (QID) | ORAL | Status: DC
  Administered 2011-06-20 – 2011-06-22 (×7): 600 mg via ORAL
  Filled 2011-06-20: qty 1

## 2011-06-20 MED ORDER — OXYTOCIN 20 UNITS IN LACTATED RINGERS INFUSION - SIMPLE: 125.0000 mL/h | INTRAVENOUS | Status: AC

## 2011-06-20 MED ORDER — HYDROMORPHONE HCL PF 1 MG/ML IJ SOLN: 0.2500 mg | INTRAMUSCULAR | Status: DC | PRN

## 2011-06-20 MED ORDER — BUPIVACAINE HCL (PF) 0.25 % IJ SOLN
INTRAMUSCULAR | Status: AC
  Filled 2011-06-20: qty 30

## 2011-06-20 MED ORDER — ONDANSETRON HCL 4 MG/2ML IJ SOLN
INTRAMUSCULAR | Status: AC
  Filled 2011-06-20: qty 2

## 2011-06-20 MED ORDER — SCOPOLAMINE 1 MG/3DAYS TD PT72
1.0000 | MEDICATED_PATCH | Freq: Once | TRANSDERMAL | Status: DC
  Administered 2011-06-20: 1.5 mg via TRANSDERMAL

## 2011-06-20 MED ORDER — MUPIROCIN 2 % EX OINT
TOPICAL_OINTMENT | CUTANEOUS | Status: AC
  Filled 2011-06-20: qty 22

## 2011-06-20 MED ORDER — KETOROLAC TROMETHAMINE 30 MG/ML IJ SOLN: 15.0000 mg | Freq: Once | INTRAMUSCULAR | Status: DC | PRN

## 2011-06-20 MED ORDER — PRENATAL MULTIVITAMIN CH
1.0000 | ORAL_TABLET | Freq: Every day | ORAL | Status: DC
  Administered 2011-06-20 – 2011-06-22 (×3): 1 via ORAL
  Filled 2011-06-20 (×2): qty 1

## 2011-06-20 MED ORDER — SCOPOLAMINE 1 MG/3DAYS TD PT72
MEDICATED_PATCH | TRANSDERMAL | Status: AC
  Administered 2011-06-20: 1.5 mg via TRANSDERMAL
  Filled 2011-06-20: qty 1

## 2011-06-20 MED ORDER — MENTHOL 3 MG MT LOZG: 1.0000 | LOZENGE | OROMUCOSAL | Status: DC | PRN

## 2011-06-20 MED ORDER — DIPHENHYDRAMINE HCL 25 MG PO CAPS: 25.0000 mg | ORAL_CAPSULE | ORAL | Status: DC | PRN

## 2011-06-20 MED ORDER — ONDANSETRON HCL 4 MG/2ML IJ SOLN
INTRAMUSCULAR | Status: DC | PRN
  Administered 2011-06-20: 4 mg via INTRAVENOUS

## 2011-06-20 MED ORDER — FENTANYL CITRATE 0.05 MG/ML IJ SOLN
INTRAMUSCULAR | Status: DC | PRN
  Administered 2011-06-20: 25 ug via INTRATHECAL

## 2011-06-20 MED ORDER — SIMETHICONE 80 MG PO CHEW
80.0000 mg | CHEWABLE_TABLET | Freq: Three times a day (TID) | ORAL | Status: DC
  Administered 2011-06-20 – 2011-06-22 (×5): 80 mg via ORAL

## 2011-06-20 MED ORDER — DIPHENHYDRAMINE HCL 50 MG/ML IJ SOLN
INTRAMUSCULAR | Status: AC
  Filled 2011-06-20: qty 1

## 2011-06-20 MED ORDER — CITALOPRAM HYDROBROMIDE 20 MG PO TABS
20.0000 mg | ORAL_TABLET | Freq: Every day | ORAL | Status: DC
  Administered 2011-06-20 – 2011-06-22 (×2): 20 mg via ORAL
  Filled 2011-06-20 (×4): qty 1

## 2011-06-20 MED ORDER — BUPIVACAINE IN DEXTROSE 0.75-8.25 % IT SOLN
INTRATHECAL | Status: DC | PRN
  Administered 2011-06-20: 1.4 mL via INTRATHECAL

## 2011-06-20 MED ORDER — OXYCODONE-ACETAMINOPHEN 5-325 MG PO TABS
1.0000 | ORAL_TABLET | ORAL | Status: DC | PRN
  Administered 2011-06-21 – 2011-06-22 (×7): 2 via ORAL
  Filled 2011-06-20 (×6): qty 2

## 2011-06-20 MED ORDER — METHYLERGONOVINE MALEATE 0.2 MG/ML IJ SOLN: 0.2000 mg | INTRAMUSCULAR | Status: DC | PRN

## 2011-06-20 MED ORDER — EPHEDRINE 5 MG/ML INJ
INTRAVENOUS | Status: AC
  Filled 2011-06-20: qty 10

## 2011-06-20 MED ORDER — SODIUM CHLORIDE 0.9 % IR SOLN
Status: DC | PRN
  Administered 2011-06-20: 1000 mL

## 2011-06-20 MED ORDER — KETOROLAC TROMETHAMINE 30 MG/ML IJ SOLN
30.0000 mg | Freq: Four times a day (QID) | INTRAMUSCULAR | Status: AC | PRN
  Administered 2011-06-20: 30 mg via INTRAVENOUS
  Filled 2011-06-20: qty 1

## 2011-06-20 MED ORDER — ZOLPIDEM TARTRATE 5 MG PO TABS: 5.0000 mg | ORAL_TABLET | Freq: Every evening | ORAL | Status: DC | PRN

## 2011-06-20 MED ORDER — LANOLIN HYDROUS EX OINT: 1.0000 "application " | TOPICAL_OINTMENT | CUTANEOUS | Status: DC | PRN

## 2011-06-20 MED ORDER — KETOROLAC TROMETHAMINE 60 MG/2ML IM SOLN
60.0000 mg | Freq: Once | INTRAMUSCULAR | Status: AC | PRN
  Administered 2011-06-20: 60 mg via INTRAMUSCULAR

## 2011-06-20 MED ORDER — DIPHENHYDRAMINE HCL 50 MG/ML IJ SOLN: 12.5000 mg | INTRAMUSCULAR | Status: DC | PRN

## 2011-06-20 MED ORDER — TETANUS-DIPHTH-ACELL PERTUSSIS 5-2.5-18.5 LF-MCG/0.5 IM SUSP: 0.5000 mL | Freq: Once | INTRAMUSCULAR | Status: DC

## 2011-06-20 MED ORDER — OXYTOCIN 10 UNIT/ML IJ SOLN
40.0000 [IU] | INTRAVENOUS | Status: DC | PRN
  Administered 2011-06-20: 40 [IU] via INTRAVENOUS

## 2011-06-20 MED ORDER — ONDANSETRON HCL 4 MG/2ML IJ SOLN: 4.0000 mg | INTRAMUSCULAR | Status: DC | PRN

## 2011-06-20 MED ORDER — SODIUM CHLORIDE 0.9 % IV SOLN
1.0000 ug/kg/h | INTRAVENOUS | Status: DC | PRN
  Filled 2011-06-20: qty 2.5

## 2011-06-20 MED ORDER — PROMETHAZINE HCL 25 MG/ML IJ SOLN: 6.2500 mg | INTRAMUSCULAR | Status: DC | PRN

## 2011-06-20 MED ORDER — ONDANSETRON HCL 4 MG PO TABS: 4.0000 mg | ORAL_TABLET | ORAL | Status: DC | PRN

## 2011-06-20 MED ORDER — MORPHINE SULFATE 0.5 MG/ML IJ SOLN
INTRAMUSCULAR | Status: AC
  Filled 2011-06-20: qty 10

## 2011-06-20 MED ORDER — DIBUCAINE 1 % RE OINT: 1.0000 "application " | TOPICAL_OINTMENT | RECTAL | Status: DC | PRN

## 2011-06-20 SURGICAL SUPPLY — 32 items
CLOTH BEACON ORANGE TIMEOUT ST (SAFETY) ×2 IMPLANT
CONTAINER PREFILL 10% NBF 15ML (MISCELLANEOUS) IMPLANT
DRESSING TELFA 8X3 (GAUZE/BANDAGES/DRESSINGS) IMPLANT
DRSG COVADERM 4X10 (GAUZE/BANDAGES/DRESSINGS) ×1 IMPLANT
ELECT REM PT RETURN 9FT ADLT (ELECTROSURGICAL) ×2
ELECTRODE REM PT RTRN 9FT ADLT (ELECTROSURGICAL) ×1 IMPLANT
EXTRACTOR VACUUM M CUP 4 TUBE (SUCTIONS) IMPLANT
GAUZE SPONGE 4X4 12PLY STRL LF (GAUZE/BANDAGES/DRESSINGS) ×2 IMPLANT
GLOVE BIO SURGEON STRL SZ7.5 (GLOVE) ×4 IMPLANT
GOWN PREVENTION PLUS LG XLONG (DISPOSABLE) ×4 IMPLANT
GOWN PREVENTION PLUS XLARGE (GOWN DISPOSABLE) ×2 IMPLANT
KIT ABG SYR 3ML LUER SLIP (SYRINGE) IMPLANT
NDL HYPO 25X1 1.5 SAFETY (NEEDLE) ×1 IMPLANT
NDL HYPO 25X5/8 SAFETYGLIDE (NEEDLE) IMPLANT
NEEDLE HYPO 25X1 1.5 SAFETY (NEEDLE) ×2 IMPLANT
NEEDLE HYPO 25X5/8 SAFETYGLIDE (NEEDLE) IMPLANT
NS IRRIG 1000ML POUR BTL (IV SOLUTION) ×2 IMPLANT
PACK C SECTION WH (CUSTOM PROCEDURE TRAY) ×2 IMPLANT
PAD ABD 7.5X8 STRL (GAUZE/BANDAGES/DRESSINGS) ×1 IMPLANT
SLEEVE SCD COMPRESS KNEE MED (MISCELLANEOUS) IMPLANT
STAPLER VISISTAT 35W (STAPLE) ×2 IMPLANT
SUT MNCRL 0 VIOLET CTX 36 (SUTURE) ×2 IMPLANT
SUT MON AB 2-0 CT1 27 (SUTURE) ×2 IMPLANT
SUT MON AB-0 CT1 36 (SUTURE) ×4 IMPLANT
SUT MONOCRYL 0 CTX 36 (SUTURE) ×2
SUT PLAIN 0 NONE (SUTURE) IMPLANT
SUT PLAIN 2 0 XLH (SUTURE) IMPLANT
SYR CONTROL 10ML LL (SYRINGE) ×2 IMPLANT
TAPE CLOTH SURG 4X10 WHT LF (GAUZE/BANDAGES/DRESSINGS) ×1 IMPLANT
TOWEL OR 17X24 6PK STRL BLUE (TOWEL DISPOSABLE) ×4 IMPLANT
TRAY FOLEY CATH 14FR (SET/KITS/TRAYS/PACK) ×2 IMPLANT
WATER STERILE IRR 1000ML POUR (IV SOLUTION) ×2 IMPLANT

## 2011-06-20 NOTE — Anesthesia Postprocedure Evaluation (Signed)
  Anesthesia Post-op Note  Patient: [name]  Procedure(s) Performed:  CESAREAN SECTION - Primary  EDD: 07/15/11/Hx of shoulder dystocia.  Primary cesarean section with delivery of baby boy at 50. Apgars 9/9.  Patient Location: PACU and Mother/Baby  Anesthesia Type: Spinal  Level of Consciousness: awake, alert  and oriented  Airway and Oxygen Therapy: Patient Spontanous Breathing   Post-op Assessment: Patient's Cardiovascular Status Stable and Respiratory Function Stable  Post-op Vital Signs: stable  Complications: No apparent anesthesia complications

## 2011-06-20 NOTE — Anesthesia Procedure Notes (Signed)
Spinal  Patient location during procedure: OR Start time: 06/20/2011 8:49 AM End time: 06/20/2011 8:53 AM Staffing Anesthesiologist: Sandrea Hughs Performed by: anesthesiologist  Preanesthetic Checklist Completed: patient identified, site marked, surgical consent, pre-op evaluation, timeout performed, IV checked, risks and benefits discussed and monitors and equipment checked Spinal Block Patient position: sitting Prep: DuraPrep Patient monitoring: heart rate, cardiac monitor, continuous pulse ox and blood pressure Approach: midline Location: L3-4 Injection technique: single-shot Needle Needle type: Sprotte  Needle gauge: 24 G Needle length: 9 cm Needle insertion depth: 9 cm Assessment Sensory level: T4

## 2011-06-20 NOTE — Anesthesia Preprocedure Evaluation (Addendum)
Anesthesia Evaluation  Patient identified by MRN, date of birth, ID band Patient awake    Reviewed: Allergy & Precautions, H&P , NPO status , Patient's Chart, lab work & pertinent test results  Airway Mallampati: II TM Distance: >3 FB Neck ROM: full    Dental No notable dental hx.    Pulmonary neg pulmonary ROS,    Pulmonary exam normal       Cardiovascular     Neuro/Psych PSYCHIATRIC DISORDERS Anxiety Depression    GI/Hepatic Neg liver ROS, GERD-  ,  Endo/Other  Morbid obesity  Renal/GU negative Renal ROS  Genitourinary negative   Musculoskeletal   Abdominal (+) obese,   Peds negative pediatric ROS (+)  Hematology negative hematology ROS (+)   Anesthesia Other Findings   Reproductive/Obstetrics (+) Pregnancy                           Anesthesia Physical Anesthesia Plan  ASA: III  Anesthesia Plan: Spinal   Post-op Pain Management:    Induction:   Airway Management Planned:   Additional Equipment:   Intra-op Plan:   Post-operative Plan:   Informed Consent: I have reviewed the patients History and Physical, chart, labs and discussed the procedure including the risks, benefits and alternatives for the proposed anesthesia with the patient or authorized representative who has indicated his/her understanding and acceptance.     Plan Discussed with: CRNA  Anesthesia Plan Comments:        Anesthesia Quick Evaluation

## 2011-06-20 NOTE — OR Nursing (Signed)
Uterus massaged by S. Wenzel Backlund RN.  Two tubes of cord blood to lab.  

## 2011-06-20 NOTE — Anesthesia Postprocedure Evaluation (Signed)
Anesthesia Post Note  Patient: [name]  Procedure(s) Performed:  CESAREAN SECTION - Primary  EDD: 07/15/11/Hx of shoulder dystocia.  Primary cesarean section with delivery of baby boy at 26. Apgars 9/9.  Anesthesia type: Spinal  Patient location: PACU  Post pain: Pain level controlled  Post assessment: Post-op Vital signs reviewed  Last Vitals:  Filed Vitals:   06/20/11 1000  BP: 121/59  Pulse: 78  Temp:   Resp: 16    Post vital signs: Reviewed  Level of consciousness: awake  Complications: No apparent anesthesia complications

## 2011-06-20 NOTE — Op Note (Signed)
Cesarean Section Procedure Note  Indications: Previous FDIU at Term. History of Severe shoulder dystocia. Moderate polyhydrmanios..Mature Amnio. Pseudotumor Cerebri with labile HTN  Pre-operative Diagnosis: 36 week 1 day pregnancy.  Post-operative Diagnosis: same  Surgeon: Lenoard Aden   Assistants: Renae Fickle, CNM  Anesthesia: Local anesthesia 0.25.% bupivacaine and Spinal anesthesia  ASA Class: 2  Procedure Details  The patient was seen in the Holding Room. The risks, benefits, complications, treatment options, and expected outcomes were discussed with the patient.  The patient concurred with the proposed plan, giving informed consent. The risks of anesthesia, infection, bleeding and possible injury to other organs discussed. Injury to bowel, bladder, or ureter with possible need for repair discussed. Possible need for transfusion with secondary risks of hepatitis or HIV acquisition discussed. Post operative complications to include but not limited to DVT, PE and Pneumonia noted. The site of surgery properly noted/marked. The patient was taken to Operating Room # 1, identified as [name] and the procedure verified as C-Section Delivery. A Time Out was held and the above information confirmed.  After induction of anesthesia, the patient was draped and prepped in the usual sterile manner. A Pfannenstiel incision was made and carried down through the subcutaneous tissue to the fascia. Fascial incision was made and extended transversely using Mayo scissors. The fascia was separated from the underlying rectus tissue superiorly and inferiorly. The peritoneum was identified and entered. Peritoneal incision was extended longitudinally. The utero-vesical peritoneal reflection was incised transversely and the bladder flap was bluntly freed from the lower uterine segment. A low transverse uterine incision(Kerr hysterotomy) was made. Copious amount of clear AF noted.  Delivered from ROT presentation  was a  female with Apgar scores of 9 at one minute and 9 at five minutes. After the umbilical cord was clamped and cut cord blood was obtained for evaluation. The placenta was removed intact and appeared normal. The uterine outline, tubes and ovaries appeared normal. The uterine incision was closed with running locked sutures of 0 Monocryl x 2 layers. Hemostasis was observed. Lavage was carried out until clear. The fascia was then reapproximated with running sutures of 0 Monocryl. Alliance tissue re-approximated with O plain suture.The skin was reapproximated with staples.  Instrument, sponge, and needle counts were correct prior the abdominal closure and at the conclusion of the case.   Findings: above  Estimated Blood Loss:  500         Drains: foley                 Specimens: placenta                 Complications:  None; patient tolerated the procedure well.         Disposition: PACU - hemodynamically stable.         Condition: stable  Attending Attestation: I performed the procedure.

## 2011-06-20 NOTE — Transfer of Care (Signed)
Immediate Anesthesia Transfer of Care Note  Patient: [name]  Procedure(s) Performed:  CESAREAN SECTION - Primary  EDD: 07/15/11/Hx of shoulder dystocia.  Primary cesarean section with delivery of baby boy at 98. Apgars 9/9.  Patient Location: PACU  Anesthesia Type: Spinal  Level of Consciousness: awake, alert  and oriented  Airway & Oxygen Therapy: Patient Spontanous Breathing  Post-op Assessment: Report given to PACU RN and Post -op Vital signs reviewed and stable  Post vital signs: Reviewed and stable  Complications: No apparent anesthesia complications

## 2011-06-20 NOTE — Progress Notes (Signed)
Patient ID: [name], female   DOB: 19-Jun-1980, 31 y.o.   MRN: [id number] Patient seen and examined. Consent witnessed and signed. No changes noted. Update completed.

## 2011-06-20 NOTE — Addendum Note (Signed)
Addendum  created 06/20/11 1918 by Len Blalock, CRNA   Modules edited:Notes Section

## 2011-06-21 LAB — CBC
HCT: 30 % — ABNORMAL LOW (ref 36.0–46.0)
Hemoglobin: 10.1 g/dL — ABNORMAL LOW (ref 12.0–15.0)
MCHC: 33.7 g/dL (ref 30.0–36.0)
RBC: 3.29 MIL/uL — ABNORMAL LOW (ref 3.87–5.11)
WBC: 17.4 10*3/uL — ABNORMAL HIGH (ref 4.0–10.5)

## 2011-06-21 NOTE — Progress Notes (Signed)

## 2011-06-21 NOTE — Progress Notes (Signed)
Subjective: POD# 1 Information for the patient's newborn:  Patric, Vanpelt [[id number]]  female  / circ deferred - foreskin defect, will f/u w/ urology  Reports feeling tired but well Feeding: breast - difficulty latching Patient reports tolerating PO.  Breast symptoms: none Pain controlled with motrin and percocet Denies HA/SOB/C/P/N/V/dizziness. Flatus absent. She reports vaginal bleeding as normal, without clots.  She is ambulating, urinating without difficult.    Desires early D/C tomorrow.   Objective:   VS:  Filed Vitals:   06/20/11 2000 06/20/11 2147 06/21/11 0200 06/21/11 0600  BP: 125/76 124/61 115/76 115/76  Pulse: 97 92 91 84  Temp: 97.7 F (36.5 C) 98.6 F (37 C) 98.6 F (37 C) 98 F (36.7 C)  TempSrc: Oral Oral Oral Oral  Resp: 20 18 20 18   Weight:      SpO2: 97% 97% 95% 96%     Intake/Output Summary (Last 24 hours) at 06/21/11 1122 Last data filed at 06/21/11 0800  Gross per 24 hour  Intake 2189.58 ml  Output   1450 ml  Net 739.58 ml        Basename 06/21/11 0500 06/19/11 1504  WBC 17.4* 12.9*  HGB 10.1* 12.9  HCT 30.0* 38.2  PLT 199 252     Blood type: --/--/O POS (01/18 1515)  Rubella: Immune (08/01 0000)     Physical Exam:  General: alert, cooperative and no distress CV: Regular rate and rhythm Resp: clear Abdomen: soft, nontender, decreased bowel sounds, obese, (+) panus Incision: dressing intact Uterine Fundus: firm, below umbilicus, nontender Lochia: minimal Ext: edema trace and Homans sign is negative, no sign of DVT      Assessment/Plan: 31 y.o.  status post Cesarean section. POD# 1.  s/p Cesarean Delivery.  Indications: hx late preterm fetal demise, LGA, polyhydramnious hx severe shoulder dystocia w/ fetal demise                 Principal Problem:  *PP care - s/p 1C/S 1/19 Depression / anxiety - stable on Celexa Hx pseudotumor cerebri and labile BP in pregnancy - stable BP post-op Ambulate, warm PO fluids to increase gut  motility Routine post-op care  PAUL,DANIELA 06/21/2011, 11:22 AM

## 2011-06-22 MED ORDER — IBUPROFEN 600 MG PO TABS: 600.0000 mg | ORAL_TABLET | Freq: Four times a day (QID) | ORAL | Status: AC

## 2011-06-22 MED ORDER — OXYCODONE-ACETAMINOPHEN 5-325 MG PO TABS: 1.0000 | ORAL_TABLET | ORAL | Status: AC | PRN

## 2011-06-22 NOTE — Plan of Care (Signed)
Problem: Discharge Progression Outcomes Goal: Remove staples per MD order Outcome: Not Applicable Date Met:  06/22/11 Will do at office, patient to make appt

## 2011-06-22 NOTE — Progress Notes (Signed)
Subjective: POD# 2 Information for the patient's newborn:  Shalandria, Elsbernd [[id number]]  female  / circ deferred   Reports feeling well, desires discharge today. Feeding: bottle, does not want to keep trying breastfeeding. Patient reports tolerating PO.  Breast symptoms: none Pain controlled with Motrin and Percocet.  Denies HA/SOB/C/P/N/V/dizziness. Flatus present. She reports vaginal bleeding as normal, without clots.  She is ambulating, urinating without difficult.     Objective:   VS:  Filed Vitals:   06/21/11 1000 06/21/11 1415 06/21/11 2225 06/22/11 0540  BP: 101/66 109/74 100/67 115/75  Pulse: 92 98 89 89  Temp: 97.8 F (36.6 C) 98.2 F (36.8 C) 98.5 F (36.9 C) 98 F (36.7 C)  TempSrc: Oral Oral Oral Oral  Resp: 18 20 18 18   Weight:      SpO2: 97%       No intake or output data in the 24 hours ending 06/22/11 0957      Basename 06/21/11 0500 06/19/11 1504  WBC 17.4* 12.9*  HGB 10.1* 12.9  HCT 30.0* 38.2  PLT 199 252     Blood type: --/--/O POS (01/18 1515)  Rubella: Immune (08/01 0000)     Physical Exam:  General: alert, cooperative, no distress and fleat affect CV: Regular rate and rhythm Resp: clear Abdomen: soft, nontender, normal bowel sounds Incision: clean, dry, intact and staples intact, (+) panus Uterine Fundus: firm, below umbilicus, nontender Lochia: minimal Ext: edema (+)1 pedal and Homans sign is negative, no sign of DVT      Assessment/Plan: 31 y.o.  POD# 2.  s/p Cesarean Delivery.  Indications: hx late preterm fetal demise, LGA, polyhydramnious hx severe shoulder dystocia w/ fetal demise                                  Depression - stable on Celexa  Doing well, stable post-op,   Bottle feeding - Engorgement management reviewed   D/C home Staples intact for removal at [facility] in 3 days.           PAUL,DANIELA 06/22/2011, 9:57 AM

## 2011-06-22 NOTE — Progress Notes (Signed)
UR chart review completed.  

## 2011-06-22 NOTE — Discharge Summary (Signed)
POSTOPERATIVE DISCHARGE SUMMARY:  Patient ID: [name] MRN: [id number] DOB/AGE: 1981/02/07 31 y.o.  Admit date: 06/20/2011 Discharge date:  06/22/2011   Admission Diagnoses: 1. Pregnancy at 36 weeks for primary cesarean section 2. Polyhydramnios 3. Large for gestational age 14. Status post mature amniocentesis 5. Hx severe shoulder dystocia with 3rd trimester fetal demise 6. Hx pseudotumor cerebri 7. Depression   Discharge Diagnoses:  1. 36 weeks pregnancy delivered 2. Post operative day 2, stable  Prenatal history: G2P1000   EDC : 07/17/2011, Alternate EDD Entry  Prenatal care at [facility] since 1st trimester   Prenatal course has been complicated by labile blood pressures. Early pregnancy symptomatology related to her pseudotumor cerebri. Briefly, placed on diuretics and discontinued. Increased anxiety and depression situational on nature and size dates discrepancy related to larger gestational age fetus and moderate polyhydramnios   Prenatal Labs: ABO, Rh: O (08/01 0000)  Antibody: Negative (08/01 0000) Rubella: Immune (08/01 0000)  RPR: NON REACTIVE (01/18 1504)  HBsAg: Negative (08/01 0000)  HIV: Non-reactive (08/01 0000)  GBS:   not done 1 hr Glucola : normal   Medical / Surgical History :  Past medical history:  Past Medical History  Diagnosis Date  . PONV (postoperative nausea and vomiting)   . Dysrhythmia     hx irregular heart beats - no current prob, stress related.  . Chronic kidney disease     kidney stone -no surgery required-passed stone  . Arthritis     hands/wrist - no meds  . IIH (idiopathic intracranial hypertension)     on lasix prior to pregnancy  . History of seasonal allergies     benadryl prn  . GERD (gastroesophageal reflux disease)     tums prn  . Anxiety     celexa daily  . Depression     celexa daily  . Headache     OTC prn  . PP care - s/p 1C/S 1/19 06/20/2011    Past surgical history:  Past Surgical  History  Procedure Date  . No past surgeries   . Cholecystectomy 01/2003  . Svd 07/27/1999    fetal demise - shoulder dystocia  . Wisdom tooth extraction     Family History:  Family History  Problem Relation Age of Onset  . Diabetes Mother   . Heart disease Mother   . Asthma Mother   . Cancer Mother   . Diabetes Father     Social History:  reports that she quit smoking about 2 years ago. Her smoking use included Cigarettes. She has a 7 pack-year smoking history. She has never used smokeless tobacco. She reports that she drinks alcohol. She reports that she does not use illicit drugs.   Allergies: Azithromycin    Current Medications at time of admission:   Celexa, prenatal vitamins, benadryl PRN    Intrapartum Course: n/a  Procedures: Cesarean section delivery of female newborn by Dr Billy Coast  See operative report for further details  Postoperative / postpartum course:  uneventful  Physical Exam:  VSS: Blood pressure 115/75, pulse 89, temperature 98 F (36.7 C), temperature source Oral, resp. rate 18, weight 233 lb (105.688 kg), last menstrual period 10/10/2010, SpO2 97.00%, currently breastfeeding.'  Pre-op labs: hgb 12.9, hct 38.2, plt 252  Post-op labs: Lab Results  Component Value Date   WBC 17.4* 06/21/2011   HGB 10.1* 06/21/2011   HCT 30.0* 06/21/2011   MCV 91.2 06/21/2011   PLT 199 06/21/2011     I&O:  I/O last 3 completed shifts: In: 1389.6 [I.V.:1389.6] Out: 1250 [Urine:1250]      Incision:  approximated with staples / no erythema / no ecchymosis / no drainage Staples: intact for removal at [facility] in 3 days.  Discharge Instructions:  Discharged Condition: good Activity: pelvic rest and weight lifting restrictions x 2 weeks Diet: routine Medications:  Current Discharge Medication List    START taking these medications   Details  ibuprofen (ADVIL,MOTRIN) 600 MG tablet Take 1 tablet (600 mg total) by mouth every 6 (six) hours. Qty: 60 tablet, Refills: 0     oxyCODONE-acetaminophen (PERCOCET) 5-325 MG per tablet Take 1-2 tablets by mouth every 3 (three) hours as needed (moderate - severe pain). Qty: 30 tablet, Refills: 0      CONTINUE these medications which have NOT CHANGED   Details  citalopram (CELEXA) 20 MG tablet Take 20 mg by mouth daily.      diphenhydrAMINE (BENADRYL) 25 mg capsule Take 25 mg by mouth every 6 (six) hours as needed. For allergies     Prenat-FeFum-FePo-FA-Omega 3 (CONCEPT DHA) 53.5-38-1 MG CAPS Take 1 capsule by mouth daily.        STOP taking these medications     acetaminophen (TYLENOL) 500 MG tablet      calcium carbonate (TUMS - DOSED IN MG ELEMENTAL CALCIUM) 500 MG chewable tablet        Condition: stable Postpartum Instructions: refer to practice specific booklet Discharge to: home Disposition: Home or Self Care Follow up :  Follow-up Information    Follow up with Lenoard Aden, MD in 6 weeks.   Contact information:   [address] [place] [address] [phone]       Follow up with [facility]. Schedule an appointment as soon as possible for a visit in 3 days. (staple removal)           Signed: Kamari Buch 06/22/2011, 10:05 AM

## 2011-06-23 ENCOUNTER — Encounter (HOSPITAL_COMMUNITY): Payer: Self-pay | Admitting: Obstetrics and Gynecology

## 2011-12-06 ENCOUNTER — Emergency Department (INDEPENDENT_AMBULATORY_CARE_PROVIDER_SITE_OTHER)
Admission: EM | Admit: 2011-12-06 | Discharge: 2011-12-06 | Disposition: A | Discharge status: Home / Self Care | Payer: Self-pay | Source: Home / Self Care | Attending: Emergency Medicine | Admitting: Emergency Medicine

## 2011-12-06 ENCOUNTER — Encounter (HOSPITAL_COMMUNITY): Payer: Self-pay | Admitting: *Deleted

## 2011-12-06 DIAGNOSIS — J020 Streptococcal pharyngitis: Secondary | ICD-10-CM

## 2011-12-06 DIAGNOSIS — J329 Chronic sinusitis, unspecified: Secondary | ICD-10-CM

## 2011-12-06 HISTORY — DX: Streptococcal pharyngitis: J02.0

## 2011-12-06 HISTORY — DX: Chronic sinusitis, unspecified: J32.9

## 2011-12-06 HISTORY — DX: Benign intracranial hypertension: G93.2

## 2011-12-06 MED ORDER — DEXAMETHASONE SODIUM PHOSPHATE 10 MG/ML IJ SOLN
INTRAMUSCULAR | Status: AC
  Filled 2011-12-06: qty 1

## 2011-12-06 MED ORDER — PSEUDOEPHEDRINE-GUAIFENESIN ER 120-1200 MG PO TB12: 1.0000 | ORAL_TABLET | Freq: Two times a day (BID) | ORAL | Status: DC

## 2011-12-06 MED ORDER — AMOXICILLIN-POT CLAVULANATE 875-125 MG PO TABS: 1.0000 | ORAL_TABLET | Freq: Two times a day (BID) | ORAL | Status: AC

## 2011-12-06 MED ORDER — IBUPROFEN 600 MG PO TABS: 600.0000 mg | ORAL_TABLET | Freq: Four times a day (QID) | ORAL | Status: AC | PRN

## 2011-12-06 MED ORDER — FLUTICASONE PROPIONATE 50 MCG/ACT NA SUSP: 2.0000 | Freq: Every day | NASAL | Status: DC

## 2011-12-06 MED ORDER — DEXAMETHASONE SODIUM PHOSPHATE 10 MG/ML IJ SOLN
10.0000 mg | Freq: Once | INTRAMUSCULAR | Status: AC
  Administered 2011-12-06: 10 mg via INTRAMUSCULAR

## 2011-12-06 NOTE — ED Provider Notes (Signed)
History     CSN: [id number]  Arrival date & time 12/06/11  1146   First MD Initiated Contact with Patient 12/06/11 1155      Chief Complaint  Patient presents with  . Sore Throat  . Dizziness  . Fever    (Consider location/radiation/quality/duration/timing/severity/associated sxs/prior treatment) HPI Comments: Patient reports frontal sinus pressure/pain for 4 days. Reports purulent nasal drainage, postnasal drip, states that her teeth hurt. Sinus pressure is worse when she bends forward or lies down. She reports sore throat starting 2 days ago accompanied with fevers Tmax 102, and tender lymphadenopathy. States she woke up this morning and noted exudates on her tonsils. She is been taking over-the-counter medications without relief. States her throat feels very swollen, but reports no difficulty breathing, stridor. Reports nausea, no vomiting. Decreased by mouth intake secondary to dysphagia and reports tachycardia, feeling lightheaded. No syncope. No drooling, trismus, pain with neck flexion/extension. No allergy or reflux symptoms. No known sick contacts. Patient has a history of frequent sinus infections and strep throat. She states this feels similar to previous episodes.    Patient is a 31 y.o. female presenting with pharyngitis. The history is provided by the patient. No language interpreter was used.  Sore Throat This is a new problem. The current episode started 2 days ago. The problem occurs constantly. The problem has not changed since onset.Pertinent negatives include no abdominal pain. The symptoms are aggravated by swallowing. Nothing relieves the symptoms. She has tried acetaminophen for the symptoms. The treatment provided no relief.    Past Medical History  Diagnosis Date  . PONV (postoperative nausea and vomiting)   . Dysrhythmia     hx irregular heart beats - no current prob, stress related.  . Arthritis     hands/wrist - no meds  . IIH (idiopathic intracranial  hypertension)     on lasix prior to pregnancy  . History of seasonal allergies     benadryl prn  . GERD (gastroesophageal reflux disease)     tums prn  . Anxiety     celexa daily  . Depression     celexa daily  . Headache     OTC prn  . PP care - s/p 1C/S 1/19 06/20/2011  . Pseudotumor cerebri   . Strep throat   . Sinusitis     Past Surgical History  Procedure Date  . No past surgeries   . Cholecystectomy 01/2003  . Svd 07/27/1999    fetal demise - shoulder dystocia  . Wisdom tooth extraction   . Cesarean section 06/20/2011    Procedure: CESAREAN SECTION;  Surgeon: Lenoard Aden, MD;  Location: [facility];  Service: Gynecology;  Laterality: N/A;  Primary  EDD: 07/15/11/Hx of shoulder dystocia.  Primary cesarean section with delivery of baby boy at 17. Apgars 9/9.    Family History  Problem Relation Age of Onset  . Diabetes Mother   . Heart disease Mother   . Asthma Mother   . Cancer Mother   . Diabetes Father     History  Substance Use Topics  . Smoking status: Former Smoker -- 0.5 packs/day for 14 years    Types: Cigarettes    Quit date: 11/29/2008  . Smokeless tobacco: Never Used  . Alcohol Use: Yes     socially but none with pregnancy    OB History    Grav Para Term Preterm Abortions TAB SAB Ect Mult Living   2 1 1  0      Review of Systems  Constitutional: Positive for fever and fatigue.  HENT: Positive for congestion, sore throat, postnasal drip and sinus pressure. Negative for ear pain.   Respiratory: Negative for cough.   Gastrointestinal: Positive for nausea. Negative for vomiting and abdominal pain.  Musculoskeletal: Negative for myalgias.  Skin: Negative for rash.  Neurological: Positive for light-headedness.    Allergies  Azithromycin  Home Medications   Current Outpatient Rx  Name Route Sig Dispense Refill  . CITALOPRAM HYDROBROMIDE 20 MG PO TABS Oral Take 20 mg by mouth daily.      . AMOXICILLIN-POT CLAVULANATE 875-125 MG PO TABS  Oral Take 1 tablet by mouth 2 (two) times daily. X 7 days 14 tablet 0  . FLUTICASONE PROPIONATE 50 MCG/ACT NA SUSP Nasal Place 2 sprays into the nose daily. 16 g 0  . IBUPROFEN 600 MG PO TABS Oral Take 1 tablet (600 mg total) by mouth every 6 (six) hours as needed for pain. 30 tablet 0  . PSEUDOEPHEDRINE-GUAIFENESIN ER [id number] MG PO TB12 Oral Take 1 tablet by mouth 2 (two) times daily. 20 each 0    BP 131/87  Pulse 106  Temp 99.3 F (37.4 C) (Oral)  Resp 18  SpO2 100%  LMP 11/26/2010  Breastfeeding? No  Physical Exam  Nursing note and vitals reviewed. Constitutional: She appears well-developed and well-nourished.  HENT:  Head: Normocephalic and atraumatic. No trismus in the jaw.  Right Ear: Tympanic membrane and ear canal normal.  Left Ear: Tympanic membrane and ear canal normal.  Nose: Mucosal edema and rhinorrhea present. No epistaxis. Right sinus exhibits maxillary sinus tenderness and frontal sinus tenderness. Left sinus exhibits maxillary sinus tenderness and frontal sinus tenderness.  Mouth/Throat: Uvula is midline and mucous membranes are normal. No uvula swelling. Oropharyngeal exudate and posterior oropharyngeal erythema present.       Purulent nasal drainage. Swollen, erythematous tonsils with extensive exudates.  Eyes: Conjunctivae and EOM are normal. Pupils are equal, round, and reactive to light.  Neck: Normal range of motion. Neck supple.  Cardiovascular: Regular rhythm and normal heart sounds.  Tachycardia present.   Pulmonary/Chest: Effort normal.  Abdominal: She exhibits no distension.  Musculoskeletal: Normal range of motion.  Lymphadenopathy:    She has cervical adenopathy.  Neurological: She is alert. Coordination normal.  Skin: Skin is warm and dry. No rash noted.  Psychiatric: She has a normal mood and affect. Her behavior is normal. Judgment and thought content normal.    ED Course  Procedures (including critical care time)  Labs Reviewed  POCT RAPID  STREP A ([facility] CARE ONLY) - Abnormal; Notable for the following:    Streptococcus, Group A Screen (Direct) POSITIVE (*)     All other components within normal limits   No results found.   1. Strep throat   2. Sinusitis     MDM  Rapid strep positive. Has concomitant sinus infection on physical exam. Sending home with augmentin for 10 days  in addition to flonase, mucinex-d, saline nasal irrigation, increase fluids, tylenol/motrin prn pain. She will start probiotics as well. Patient to followup with PMD of choice, will refer to local primary care resources.    Luiz Blare, MD 12/06/11 1326

## 2011-12-06 NOTE — ED Notes (Signed)
Pt with c/o sore throat/fever/tachycardia/dizziness x 2 days  - history of strep throat  otc med without relief - salt water gargle

## 2012-11-05 DIAGNOSIS — F411 Generalized anxiety disorder: Secondary | ICD-10-CM | POA: Insufficient documentation

## 2013-02-21 DIAGNOSIS — G4733 Obstructive sleep apnea (adult) (pediatric): Secondary | ICD-10-CM | POA: Insufficient documentation

## 2013-07-02 HISTORY — PX: HYSTEROTOMY: SHX1776

## 2014-04-02 ENCOUNTER — Encounter (HOSPITAL_COMMUNITY): Payer: Self-pay | Admitting: *Deleted

## 2015-07-23 DIAGNOSIS — F4310 Post-traumatic stress disorder, unspecified: Secondary | ICD-10-CM | POA: Insufficient documentation

## 2016-03-20 DIAGNOSIS — G43009 Migraine without aura, not intractable, without status migrainosus: Secondary | ICD-10-CM | POA: Insufficient documentation

## 2016-03-20 DIAGNOSIS — Z8669 Personal history of other diseases of the nervous system and sense organs: Secondary | ICD-10-CM | POA: Insufficient documentation

## 2017-06-15 DIAGNOSIS — F330 Major depressive disorder, recurrent, mild: Secondary | ICD-10-CM | POA: Insufficient documentation

## 2018-01-04 DIAGNOSIS — K582 Mixed irritable bowel syndrome: Secondary | ICD-10-CM | POA: Insufficient documentation

## 2018-11-10 NOTE — Progress Notes (Addendum)
Virtual Visit via Video Note The purpose of this virtual visit is to provide medical care while limiting exposure to the novel coronavirus.    Consent was obtained for video visit:  Yes.   Answered questions that patient had about telehealth interaction:  Yes.   I discussed the limitations, risks, security and privacy concerns of performing an evaluation and management service by telemedicine. I also discussed with the patient that there may be a patient responsible charge related to this service. The patient expressed understanding and agreed to proceed.  Pt location: Home Physician Location: office Name of referring provider:  Vernona Rieger, MD I connected with Royetta Asal at patients initiation/request on 11/11/2018 at  8:30 AM EDT by video enabled telemedicine application and verified that I am speaking with the correct person using two identifiers. Pt MRN:  [id number] Pt DOB:  1980/09/28 Video Participants:  Royetta Asal;     History of Present Illness:  [name] is a 38 year old Caucasian woman who presents for headaches.  History supplemented by referring provider note.  Onset:  1996 Location:  Left sided from front to back and down the side of neck. Quality:  Sometimes throbbing, sometimes shooting Intensity:  8/10.  She denies new headache, thunderclap headache Aura:  no Premonitory Phase:  lethargy Postdrome:  lethargy Associated symptoms:  Nausea, sometimes vomiting, photophobia, phonophobia, osmophobia, blurred vision/clouding of vision, dizziness.  She denies associated unilateral numbness or weakness. Duration:  1 to 3 or 4 days Frequency:  Once a week (5 to 10 days a month on average) Frequency of abortive medication: maybe once a week Triggers:  Change in weather, emotional stress Relieving factors:  Ice packs Activity:  Aggravates  She was diagnosed with idiopathic intracranial hypertension in 2011.  She presented with severe headaches.  Eye  exam revealed papilledema.  She did have prior brain MRIs.  She was previously on Diamox and Lasix.  Last LP was in 2012 while pregnant.  No symptoms since then.  Denies visual disturbance other than with headache.  She hasn't had an eye exam in several years.  Current NSAIDS:  ibuprofen Current analgesics:  none Current triptans:  none Current ergotamine:  none Current anti-emetic:  none Current muscle relaxants:  none Current anti-anxiolytic:  Xanax Current sleep aide:  none Current Antihypertensive medications:  Metoprolol succinate 12.5mg  twice daily (for tachycardia) Current Antidepressant medications:  Prozac 20mg  twice daily Current Anticonvulsant medications:  none Current anti-CGRP:  none Current Vitamins/Herbal/Supplements:  D3 Current Antihistamines/Decongestants:  none Other therapy:  none Hormone/birth control:  none  Past NSAIDS:  Aleve Past analgesics:  Tylenol, Excedrin, possibly Fioricet Past abortive triptans:  Sumatriptan [place] (chest tightness/difficulty breathing), Maxalt (ineffective) Past abortive ergotamine:  no Past muscle relaxants:  no Past anti-emetic:  promethazine Past antihypertensive medications:  no Past antidepressant medications:  Sertraline, maybe amitriptyline, Celexa, Effexor Past anticonvulsant medications:  topiramate (kidney issues) Past anti-CGRP:  no Past vitamins/Herbal/Supplements:  no Past antihistamines/decongestants:  no Other past therapies:  no  Caffeine:  No coffee.  Occasional Coke Alcohol:  rarely Smoker:  2-3 cigarettes daily Diet:  16 oz water daily.  Skips meals.   Exercise:  yes Depression:  improving; Anxiety:  Improving.  Sees a therapist. Other pain:  Neck pain.  Sometimes associated with pain and numbness into the fingers involving either arm) Sleep hygiene:  Some trouble falling asleep Family history of headache:  Second cousin (IIH, "brain tumor"); great grandfather both had IIH  Past  Medical History: Past Medical  History:  Diagnosis Date  . Chronic GERD   . Eczema   . Hypertension   . IBS (irritable bowel syndrome)   . Major depressive disorder   . Migraine   . Neck pain   . PTSD (post-traumatic stress disorder)   . Tachycardia, unspecified     Medications: Outpatient Encounter Medications as of 11/11/2018  Medication Sig  . ALPRAZolam (XANAX) 0.5 MG tablet Take 0.5 mg by mouth 3 (three) times daily as needed for anxiety.  . Cholecalciferol (VITAMIN D3 PO) Take 2,000 Int'l Units by mouth daily.  Marland Kitchen FLUoxetine (PROZAC) 20 MG tablet Take 20 mg by mouth 2 (two) times a day.  . ibuprofen (ADVIL) 200 MG tablet Take 200 mg by mouth every 6 (six) hours as needed. 800mg  per dose  . Metoprolol Succinate 25 MG CS24 Take by mouth. Half tab in am, half tab in pm  . mometasone (ELOCON) 0.1 % ointment   . pantoprazole (PROTONIX) 40 MG tablet Take 40 mg by mouth daily.   No facility-administered encounter medications on file as of 11/11/2018.     Allergies: Allergies  Allergen Reactions  . Topamax [Topiramate] Other (See Comments)    Kidney stones  . Vicodin [Hydrocodone-Acetaminophen] Itching    Family History: Family History  Problem Relation Age of Onset  . Heart attack Father   . Diabetes Maternal Grandmother   . Diabetes Maternal Grandfather     Social History: Social History   Socioeconomic History  . Marital status: Married    Spouse name: Not on file  . Number of children: 2  . Years of education: Not on file  . Highest education level: Not on file  Occupational History  . Occupation: Associate Professor  Social Needs  . Financial resource strain: Not on file  . Food insecurity    Worry: Not on file    Inability: Not on file  . Transportation needs    Medical: Not on file    Non-medical: Not on file  Tobacco Use  . Smoking status: Current Every Day Smoker    Packs/day: 0.50    Start date: 52  . Smokeless tobacco: Never Used  Substance and Sexual Activity  . Alcohol use:  Yes    Comment: Socially  . Drug use: Never  . Sexual activity: Yes    Partners: Male    Birth control/protection: None  Lifestyle  . Physical activity    Days per week: Not on file    Minutes per session: Not on file  . Stress: Not on file  Relationships  . Social Musician on phone: Not on file    Gets together: Not on file    Attends religious service: Not on file    Active member of club or organization: Not on file    Attends meetings of clubs or organizations: Not on file    Relationship status: Not on file  . Intimate partner violence    Fear of current or ex partner: Not on file    Emotionally abused: Not on file    Physically abused: Not on file    Forced sexual activity: Not on file  Other Topics Concern  . Not on file  Social History Narrative   Right handed   Lives in two story home with husband and 2 children    Observations/Objective:   Height 5\' 6"  (1.676 m), weight 236 lb (107 kg). No acute distress.  Alert and  oriented.  Speech fluent and not dysarthric.  Language intact.  Face symmetric.    Assessment and Plan:   1.  Migraine without aura, without status migrainosus, not intractable 2.  Idiopathic intracranial hypertension 3.  Cervicalgia, cervical radiculopathy  1.  For preventative management, Aimovig 70mg  monthly 2.  For abortive therapy, Ubrelvy 100mg  3. Refer to ophthalmology for evaluation of papilledema 4. Refer to Sports Medicine for treatment of neck pain/OMM 5. Tizanidine 2mg  at bedtime as needed for neck pain 6.  Limit use of pain relievers to no more than 2 days out of week to prevent risk of rebound or medication-overuse headache. 7.  Keep headache diary 8.  Exercise, weight loss, hydration, caffeine cessation, sleep hygiene, monitor for and avoid triggers 9.  Consider:  magnesium citrate 400mg  daily, riboflavin 400mg  daily, and coenzyme Q10 100mg  three times daily 10. Always keep in mind that currently taking a hormone or  birth control may be a possible trigger or aggravating factor for migraine. 11. Obtain recent labs (BMP/kidney function) from PCP 12.  Follow up in 4 months.   Follow Up Instructions:    -I discussed the assessment and treatment plan with the patient. The patient was provided an opportunity to ask questions and all were answered. The patient agreed with the plan and demonstrated an understanding of the instructions.   The patient was advised to call back or seek an in-person evaluation if the symptoms worsen or if the condition fails to improve as anticipated.    Total Time spent in visit with the patient was:  45 minutes  Cira Servant, DO

## 2018-11-11 ENCOUNTER — Other Ambulatory Visit: Payer: Self-pay

## 2018-11-11 ENCOUNTER — Encounter: Payer: Self-pay | Admitting: Neurology

## 2018-11-11 ENCOUNTER — Telehealth (INDEPENDENT_AMBULATORY_CARE_PROVIDER_SITE_OTHER): Payer: 59 | Admitting: Neurology

## 2018-11-11 ENCOUNTER — Telehealth: Payer: Self-pay

## 2018-11-11 VITALS — Ht 66.0 in | Wt 236.0 lb

## 2018-11-11 DIAGNOSIS — R000 Tachycardia, unspecified: Secondary | ICD-10-CM | POA: Insufficient documentation

## 2018-11-11 DIAGNOSIS — K219 Gastro-esophageal reflux disease without esophagitis: Secondary | ICD-10-CM | POA: Insufficient documentation

## 2018-11-11 DIAGNOSIS — F339 Major depressive disorder, recurrent, unspecified: Secondary | ICD-10-CM

## 2018-11-11 DIAGNOSIS — G932 Benign intracranial hypertension: Secondary | ICD-10-CM

## 2018-11-11 DIAGNOSIS — G43909 Migraine, unspecified, not intractable, without status migrainosus: Secondary | ICD-10-CM | POA: Insufficient documentation

## 2018-11-11 DIAGNOSIS — G43009 Migraine without aura, not intractable, without status migrainosus: Secondary | ICD-10-CM

## 2018-11-11 DIAGNOSIS — M542 Cervicalgia: Secondary | ICD-10-CM

## 2018-11-11 DIAGNOSIS — F329 Major depressive disorder, single episode, unspecified: Secondary | ICD-10-CM | POA: Insufficient documentation

## 2018-11-11 DIAGNOSIS — I10 Essential (primary) hypertension: Secondary | ICD-10-CM

## 2018-11-11 MED ORDER — UBRELVY 100 MG PO TABS: 1.0000 | ORAL_TABLET | ORAL | 3 refills | Status: DC | PRN

## 2018-11-11 MED ORDER — TIZANIDINE HCL 2 MG PO TABS: 2.0000 mg | ORAL_TABLET | Freq: Every evening | ORAL | 3 refills | Status: DC | PRN

## 2018-11-11 MED ORDER — AIMOVIG 70 MG/ML ~~LOC~~ SOAJ: 70.0000 mg | SUBCUTANEOUS | 3 refills | Status: DC

## 2018-11-11 NOTE — Telephone Encounter (Addendum)
Called and LMOVM for Pt to return my call. I advised I have information for co-pay cards for Ubrevly and Aimovig for her. I also advised about My Chart if she is interested.  Pt called back. She gave me permission to text the [name] co-pay card information to [phone]. I took pictures and sent it by text to her. BIN: [id number] PCN: [id number] GRP: [id number] ID#: [id number] I gave her [email or web address] to register for the co-pay for [place]. 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

## 2018-11-11 NOTE — Progress Notes (Signed)
Aimovig prior authorization submitted via covermymeds.  Waiting response.

## 2018-11-11 NOTE — Progress Notes (Signed)
[name] prior authorization submitted via covermymeds.  Waiting response.

## 2018-11-14 NOTE — Progress Notes (Signed)
Ubrelvy denied.  Patient has copay card.  BIN: [id number] PCN: [id number] GRP: [id number] ID#: [id number]

## 2018-11-14 NOTE — Progress Notes (Signed)
Request Reference Number: [id number]. AIMOVIG INJ 70MG /ML is denied for not meeting the prior authorization requirement(s). For further questions, call [phone]. Appeals are not supported through [place]. Please refer to the fax case notice for appeals information and instructions.

## 2018-12-07 NOTE — Progress Notes (Signed)
Appeal has been denied.

## 2018-12-30 ENCOUNTER — Telehealth: Payer: Self-pay

## 2018-12-30 NOTE — Telephone Encounter (Signed)
Rcvd call from [place] with Cover My Meds. She wanted to check status on PA for aimovig. I advised her it was denied, an appeal was started and we have not rcvd a determination. She will check status on appeal.  I advised her Pt is using co-pay card.

## 2019-02-03 ENCOUNTER — Telehealth: Payer: Self-pay | Admitting: Neurology

## 2019-02-03 ENCOUNTER — Telehealth: Payer: Self-pay | Admitting: *Deleted

## 2019-02-03 ENCOUNTER — Other Ambulatory Visit: Payer: Self-pay | Admitting: Neurology

## 2019-02-03 MED ORDER — EMGALITY 120 MG/ML ~~LOC~~ SOAJ: 120.0000 mg | SUBCUTANEOUS | 11 refills | Status: DC

## 2019-02-03 MED ORDER — EMGALITY 120 MG/ML ~~LOC~~ SOAJ: 240.0000 mg | Freq: Once | SUBCUTANEOUS | 0 refills | Status: AC

## 2019-02-03 NOTE — Telephone Encounter (Signed)
Okay, she has BCBS, now stating she wants the Emgality called in. The Amovig has no longivity and not working, headaches are continuing. Please advise

## 2019-02-03 NOTE — Telephone Encounter (Signed)
Please advise on medication

## 2019-02-03 NOTE — Telephone Encounter (Signed)
Actually, she has [employer].  Therefore she should be eligible for the [place], which should cost her no more than $5 a month.

## 2019-02-03 NOTE — Telephone Encounter (Signed)
Can you send rx in please

## 2019-02-03 NOTE — Telephone Encounter (Signed)
I sent prescription for Emgality to her [facility] in [place].  She should be aware it will need prior auth

## 2019-02-03 NOTE — Telephone Encounter (Signed)
Patient called with questions for the nurse about her medication Aimovig. She said she is interested in taking Emgality instead maybe.

## 2019-02-03 NOTE — Telephone Encounter (Signed)
Then we can prescribe Emgality.  The first dose requires 2 injections of 120mg  (240mg  total).  Then it is only 1 120mg  injection every 30 days thereafter.  We will need to get a prior auth

## 2019-02-03 NOTE — Telephone Encounter (Signed)
I called to get insurance info. She said they changed insurance from [facility] to [facility] Her ID is: [name] [id number] RX BIN [id number] She stated that she wasn't to talk to Dr. Tomi Likens about her meds before I start her PA on Aimovig or [name] so I will hold off for now.

## 2019-02-08 ENCOUNTER — Encounter: Payer: Self-pay | Admitting: *Deleted

## 2019-02-08 NOTE — Progress Notes (Addendum)
[name] (Key: [id number])   Your information has been submitted to [place]. [employer] [place] will review the request and fax you a determination directly, typically within 3 business days of your submission once all necessary information is received. If [employer] [place] has not responded in 3 business days or if you have any questions about your submission, contact [place] at [phone]. [name] (Key: [id number])  Rx #: [id number]  Emgality 120MG /ML auto-injectors (migraine)  Form   Form Blue [employer] control surveyor Form (CB) Created 3 days ago Sent to Plan 1 day ago Plan Response 1 day ago Submit Clinical Questions 1 day ago Determination Favorable 38 minutes ago Message from Plan Effective from 02/08/2019 through 05/08/2019. initial

## 2019-02-17 ENCOUNTER — Encounter: Payer: Self-pay | Admitting: *Deleted

## 2019-02-17 NOTE — Progress Notes (Signed)
[name] (Key: [id number]) Rx #: [id number] [name] 100MG  tablets   Form Blue Cross  Commercial Electronic Request Form (CB) Created 2 days ago Sent to Plan 3 minutes ago Plan Response 3 minutes ago Submit Clinical Questions less than a minute ago Determination Favorable less than a minute ago Message from Plan Effective from 02/17/2019 through 05/11/2019.

## 2019-03-11 ENCOUNTER — Other Ambulatory Visit: Payer: Self-pay | Admitting: Neurology

## 2019-03-13 NOTE — Telephone Encounter (Signed)
Requested Prescriptions   Pending Prescriptions Disp Refills   tiZANidine (ZANAFLEX) 2 MG tablet [Pharmacy Med Name: TIZANIDINE 2MG  TABLETS] 30 tablet 3    Sig: TAKE 1 TABLET(2 MG) BY MOUTH AT BEDTIME AS NEEDED FOR MUSCLE SPASMS   Rx last filled:11/11/18 #30 3 REFILS  Pt last seen: 11/11/18  Follow up appt scheduled:NONE

## 2019-04-26 NOTE — Progress Notes (Signed)
Renewal for EMGALITY 120 MG sent to plan via covermymeds.  Waiting response.

## 2019-05-01 NOTE — Progress Notes (Signed)
Emgality prior authorization denied.  Approved when patient uses fewer migraine rescue medications such as ibuprofen, naproxen, sumatriptan each month.

## 2019-05-01 NOTE — Progress Notes (Signed)
Received hard fax from [facility] regarding authorization notification  Status: approved effective dates of the authorization: 05/01/19 -04/29/2020  Ref# [id number]  [phone]  FORM SENT TO BE SCAN

## 2019-05-01 NOTE — Progress Notes (Signed)
(  Key: [id number] Meier 100MG  tablets prior authorization submitted via covermymeds.  Waiting response.Has been submitted to [facility]. Blue [employer] [place] will review the request and fax you a determination directly, typically within 3 business days of your submission once all necessary information is received. If [employer] [place] has not responded in 3 business days or if you have any questions about your submission, contact [place] at [phone].

## 2019-06-13 ENCOUNTER — Encounter: Payer: Self-pay | Admitting: Neurology

## 2019-06-13 NOTE — Progress Notes (Signed)
Denied  You may complete an appeal and request a re-evaluation of the coverage determination for this patient. For patients not covered by Medicare or Medicaid, information about how to complete an appeal for such a patient will be sent to you shortly. If you would like to start an appeal for a patient not covered by Medicare or Medicaid, please call CoverMyMeds at [phone].

## 2019-06-13 NOTE — Progress Notes (Signed)
Attempted to submit the PA for medication but issue with patients insurance pending that to be fixed

## 2019-06-13 NOTE — Progress Notes (Signed)
Patient has BCBS [id number]  PA submitted  Key [id number]

## 2019-06-16 NOTE — Progress Notes (Signed)
Provider courtesy review approval  Ref#[id number]  Valid 06/13/2019-06/11/2020

## 2019-07-06 ENCOUNTER — Other Ambulatory Visit: Payer: Self-pay | Admitting: Neurology

## 2019-08-01 ENCOUNTER — Telehealth: Payer: Self-pay | Admitting: Neurology

## 2019-08-01 NOTE — Telephone Encounter (Signed)
Patient feeling some better. Will touch base in the am. Unable to come in at this time.

## 2019-08-01 NOTE — Telephone Encounter (Signed)
If she can get here within an hour with a driver, we can give her a headache cocktail.  If she doesn't have a driver, she can come and we can give her a toradol shot to break the migraine.  She should also make a follow up appointment

## 2019-08-01 NOTE — Telephone Encounter (Signed)
Patient has had a migraine since Sunday. She states that she is having wavy flashing lights and dark spots and flashing triangles.  Pain not is bad as it was Sunday  Please call

## 2019-08-01 NOTE — Telephone Encounter (Signed)
Any suggestions>?

## 2019-09-08 DIAGNOSIS — J324 Chronic pansinusitis: Secondary | ICD-10-CM | POA: Insufficient documentation

## 2019-09-08 DIAGNOSIS — J342 Deviated nasal septum: Secondary | ICD-10-CM | POA: Insufficient documentation

## 2019-09-08 DIAGNOSIS — J302 Other seasonal allergic rhinitis: Secondary | ICD-10-CM | POA: Insufficient documentation

## 2019-09-08 DIAGNOSIS — J343 Hypertrophy of nasal turbinates: Secondary | ICD-10-CM | POA: Insufficient documentation

## 2019-09-22 ENCOUNTER — Other Ambulatory Visit: Payer: Self-pay | Admitting: Orthopedic Surgery

## 2019-09-22 DIAGNOSIS — M542 Cervicalgia: Secondary | ICD-10-CM

## 2019-09-22 DIAGNOSIS — M545 Low back pain, unspecified: Secondary | ICD-10-CM

## 2019-09-22 DIAGNOSIS — M546 Pain in thoracic spine: Secondary | ICD-10-CM

## 2019-10-20 ENCOUNTER — Ambulatory Visit
Admission: RE | Admit: 2019-10-20 | Discharge: 2019-10-20 | Disposition: A | Discharge status: Home / Self Care | Payer: BC Managed Care – PPO | Source: Ambulatory Visit | Attending: Orthopedic Surgery | Admitting: Orthopedic Surgery

## 2019-10-20 ENCOUNTER — Other Ambulatory Visit: Payer: Self-pay

## 2019-10-20 DIAGNOSIS — M546 Pain in thoracic spine: Secondary | ICD-10-CM

## 2019-10-20 DIAGNOSIS — M545 Low back pain, unspecified: Secondary | ICD-10-CM

## 2019-10-20 DIAGNOSIS — M542 Cervicalgia: Secondary | ICD-10-CM

## 2019-10-20 MED ORDER — GADOBENATE DIMEGLUMINE 529 MG/ML IV SOLN
20.0000 mL | Freq: Once | INTRAVENOUS | Status: AC | PRN
  Administered 2019-10-20: 20 mL via INTRAVENOUS

## 2019-11-14 NOTE — Progress Notes (Signed)
Virtual Visit via Video Note The purpose of this virtual visit is to provide medical care while limiting exposure to the novel coronavirus.    Consent was obtained for video visit:  Yes.   Answered questions that patient had about telehealth interaction:  Yes.   I discussed the limitations, risks, security and privacy concerns of performing an evaluation and management service by telemedicine. I also discussed with the patient that there may be a patient responsible charge related to this service. The patient expressed understanding and agreed to proceed.  Pt location: Home Physician Location: office Name of referring provider:  Vernona Rieger, MD I connected with Royetta Asal at patients initiation/request on 11/16/2019 at  3:30 PM EDT by video enabled telemedicine application and verified that I am speaking with the correct person using two identifiers. Pt MRN:  [id number] Pt DOB:  January 20, 1981 Video Participants:  Royetta Asal;   History of Present Illness:  [name] is a 39 year old Caucasian woman who follows up for migraine, IIH and neck pain.  UPDATE: Last seen in initial consultation in June 2020. She saw Dr. Alben Spittle at [facility] in July 2020.  No papilledema noted on exam.  Aimovig ineffective.  Started [employer]  She did well on Emgality.  However, she got bronchitis in the beginning of the year and was advised to stop all medications.  Since then, she has had increased headaches.  They can be severe and occur every other week, now either side from back of head radiating to the front.  They last 2-3 hours with Ubrelvy.  She feels nauseous.  More recently, she has had migraine with visual aura (wavy flashing lights, dark spots, flashing triangles) as well as dizzy spells.    For 10 years, she reports intermittent numbness and tingling various parts of her body and burning on back of neck, shoulders and sometimes down the arms or down the legs.  They  often occur a couple of times a week and may last up to 3 hours.  She reports that she has decreased sensation worse on her left side when compared to her right sided.  She saw a spine specialist.  MRI of cervical, thoracic and lumbar spine on 10/20/2019 personally reviewed was unremarkable with minimal spurring at C4-5 and mild lumbar facet hypertrophy but nothing significant that would explain her symptoms.  Current NSAIDS:  ibuprofen Current analgesics:  none Current triptans:  none Current ergotamine:  none Current anti-emetic:  none Current muscle relaxants:  tizanidine 2mg  QHS Current anti-anxiolytic:  Xanax Current sleep aide:  none Current Antihypertensive medications:  Metoprolol succinate 12.5mg  twice daily (for tachycardia) Current Antidepressant medications:  Prozac 20mg  twice daily Current Anticonvulsant medications:  none Current anti-CGRP: Emgality; Ubrelvy 100mg  Current Vitamins/Herbal/Supplements:  D3 Current Antihistamines/Decongestants:  none Other therapy:  none Hormone/birth control:  none  Caffeine:  No coffee.  Occasional Coke Alcohol:  rarely Smoker:  2-3 cigarettes daily Diet:  16 oz water daily.  Skips meals.   Exercise:  yes Depression:  improving; Anxiety:  Improving.  Sees a therapist. Other pain:  Neck pain.  Sometimes associated with pain and numbness into the fingers involving either arm) Sleep hygiene:  Some trouble falling asleep  HISTORY: Onset:  1996 Location:  Left sided from front to back and down the side of neck. Quality:  Sometimes throbbing, sometimes shooting Initial intensity:  8/10.  She denies new headache, thunderclap headache Aura:  no Premonitory Phase:  lethargy Postdrome:  lethargy Associated symptoms:  Nausea, sometimes vomiting, photophobia, phonophobia, osmophobia, blurred vision/clouding of vision, dizziness.  She denies associated unilateral numbness or weakness. Initial duration:  1 to 3 or 4 days Initial frequency:  Once  a week (5 to 10 days a month on average) Initial frequency of abortive medication: maybe once a week Triggers:  Change in weather, emotional stress Relieving factors:  Ice packs Activity:  Aggravates  She was diagnosed with idiopathic intracranial hypertension in 2011.  She presented with severe headaches.  Eye exam revealed papilledema.  She did have prior brain MRI over a decade ago.  She was previously on Diamox and Lasix.  Last LP was in 2012 while pregnant.  No symptoms since then.  Denies visual disturbance other than with headache.     Past NSAIDS:  Aleve Past analgesics:  Tylenol, Excedrin, possibly Fioricet Past abortive triptans:  Sumatriptan [place] (chest tightness/difficulty breathing), Maxalt (ineffective) Past abortive ergotamine:  no Past muscle relaxants:  no Past anti-emetic:  promethazine Past antihypertensive medications:  no Past antidepressant medications:  Sertraline, maybe amitriptyline, Celexa, Effexor Past anticonvulsant medications:  topiramate (kidney issues) Past anti-CGRP:  Aimovig (ineffective) Past vitamins/Herbal/Supplements:  no Past antihistamines/decongestants:  no Other past therapies:  no   Family history of headache:  Second cousin (IIH, "brain tumor"); great grandfather both had IIH  Past Medical History: Past Medical History:  Diagnosis Date  . Chronic GERD   . Eczema   . Hypertension   . IBS (irritable bowel syndrome)   . Major depressive disorder   . Migraine   . Neck pain   . PTSD (post-traumatic stress disorder)   . Tachycardia, unspecified     Medications: Outpatient Encounter Medications as of 11/16/2019  Medication Sig  . ALPRAZolam (XANAX) 0.5 MG tablet Take 0.5 mg by mouth 3 (three) times daily as needed for anxiety.  . Cholecalciferol (VITAMIN D3 PO) Take 2,000 Int'l Units by mouth daily.  Eduard Roux (AIMOVIG) 70 MG/ML SOAJ Inject 70 mg into the skin every 30 (thirty) days.  Marland Kitchen FLUoxetine (PROZAC) 20 MG tablet Take 20  mg by mouth 2 (two) times a day.  . Galcanezumab-gnlm (EMGALITY) 120 MG/ML SOAJ Inject 120 mg into the skin every 30 (thirty) days.  Marland Kitchen ibuprofen (ADVIL) 200 MG tablet Take 200 mg by mouth every 6 (six) hours as needed. 800mg  per dose  . Metoprolol Succinate 25 MG CS24 Take by mouth. Half tab in am, half tab in pm  . mometasone (ELOCON) 0.1 % ointment   . pantoprazole (PROTONIX) 40 MG tablet Take 40 mg by mouth daily.  Marland Kitchen tiZANidine (ZANAFLEX) 2 MG tablet TAKE 1 TABLET(2 MG) BY MOUTH AT BEDTIME AS NEEDED FOR MUSCLE SPASMS  . UBRELVY 100 MG TABS TAKE 1 TABLET BY MOUTH AS NEEDED, MAY REPEAT DOSE AFTER 2 HOURS IF NEEDED. NOT TO EXCEED 2 TABLETS IN 24 HOURS   No facility-administered encounter medications on file as of 11/16/2019.    Allergies: Allergies  Allergen Reactions  . Topamax [Topiramate] Other (See Comments)    Kidney stones  . Vicodin [Hydrocodone-Acetaminophen] Itching    Family History: Family History  Problem Relation Age of Onset  . Heart attack Father   . Diabetes Maternal Grandmother   . Diabetes Maternal Grandfather     Social History: Social History   Socioeconomic History  . Marital status: Married    Spouse name: Not on file  . Number of children: 2  . Years of education: Not on file  . Highest  education level: Not on file  Occupational History  . Occupation: Associate Professor  Tobacco Use  . Smoking status: Current Every Day Smoker    Packs/day: 0.50    Start date: 24  . Smokeless tobacco: Never Used  Vaping Use  . Vaping Use: Never used  Substance and Sexual Activity  . Alcohol use: Yes    Comment: Socially  . Drug use: Never  . Sexual activity: Yes    Partners: Male    Birth control/protection: None  Other Topics Concern  . Not on file  Social History Narrative   Right handed   Lives in two story home with husband and 2 children   Social Determinants of Health   Financial Resource Strain:   . Difficulty of Paying Living Expenses:   Food  Insecurity:   . Worried About Programme researcher, broadcasting/film/video in the Last Year:   . Barista in the Last Year:   Transportation Needs:   . Freight forwarder (Medical):   Marland Kitchen Lack of Transportation (Non-Medical):   Physical Activity:   . Days of Exercise per Week:   . Minutes of Exercise per Session:   Stress:   . Feeling of Stress :   Social Connections:   . Frequency of Communication with Friends and Family:   . Frequency of Social Gatherings with Friends and Family:   . Attends Religious Services:   . Active Member of Clubs or Organizations:   . Attends Banker Meetings:   Marland Kitchen Marital Status:   Intimate Partner Violence:   . Fear of Current or Ex-Partner:   . Emotionally Abused:   Marland Kitchen Physically Abused:   . Sexually Abused:     Observations/Objective:   There were no vitals taken for this visit. No acute distress.  Alert and oriented.  Speech fluent and not dysarthric.  Language intact.  Eyes orthophoric on primary gaze.  Face symmetric.  Assessment and Plan:   1.  Migraine with aura, without status migrainosus, not intractable 2.  Paresthesias.  Unclear etiology.  No pattern of a peripheral neuropathy.  Not associated with headaches to suggest migraine aura.  Would evaluate for intracranial abnormality or vitamin deficiency.  1. To evaluate paresthesias and visual disturbance, will check MRI of brain with and without contrast.  Will also check B12, folate, and TSH.  2. For preventative management, will restart Emgality.  She will pick up sample on Monday. 3.  For abortive therapy, Ubrelvy. 4. Advised to make a follow up appointment with Dr. Alben Spittle. 5.  Limit use of pain relievers to no more than 2 days out of week to prevent risk of rebound or medication-overuse headache. 6.  Keep headache diary 7.  Exercise, hydration, caffeine cessation, sleep hygiene, monitor for and avoid triggers 8.  Follow up 4 months.   Follow Up Instructions:    -I discussed the  assessment and treatment plan with the patient. The patient was provided an opportunity to ask questions and all were answered. The patient agreed with the plan and demonstrated an understanding of the instructions.   The patient was advised to call back or seek an in-person evaluation if the symptoms worsen or if the condition fails to improve as anticipated.   Cira Servant, DO

## 2019-11-16 ENCOUNTER — Other Ambulatory Visit: Payer: Self-pay

## 2019-11-16 ENCOUNTER — Telehealth (INDEPENDENT_AMBULATORY_CARE_PROVIDER_SITE_OTHER): Payer: BC Managed Care – PPO | Admitting: Neurology

## 2019-11-16 ENCOUNTER — Encounter: Payer: Self-pay | Admitting: Neurology

## 2019-11-16 DIAGNOSIS — H539 Unspecified visual disturbance: Secondary | ICD-10-CM | POA: Diagnosis not present

## 2019-11-16 DIAGNOSIS — R202 Paresthesia of skin: Secondary | ICD-10-CM

## 2019-11-16 DIAGNOSIS — G43109 Migraine with aura, not intractable, without status migrainosus: Secondary | ICD-10-CM

## 2019-11-16 MED ORDER — UBRELVY 100 MG PO TABS: ORAL_TABLET | ORAL | 3 refills | Status: DC

## 2019-11-16 MED ORDER — EMGALITY 120 MG/ML ~~LOC~~ SOAJ: 120.0000 mg | SUBCUTANEOUS | 11 refills | Status: DC

## 2019-11-16 NOTE — Patient Instructions (Signed)
1.  We will restart Emgality.  Pick up sample on Monday 2.  Refilled Ubrelvy 3.  On Monday, come to the office for labs. 4.  We will check MRI of brain with and without contrast. 5.  Follow up in 4 months.

## 2019-11-17 ENCOUNTER — Other Ambulatory Visit: Payer: Self-pay | Admitting: Neurology

## 2019-12-27 ENCOUNTER — Other Ambulatory Visit: Payer: BC Managed Care – PPO

## 2020-01-02 ENCOUNTER — Other Ambulatory Visit: Payer: Self-pay | Admitting: Neurology

## 2020-01-02 ENCOUNTER — Telehealth: Payer: Self-pay | Admitting: Neurology

## 2020-01-02 MED ORDER — CYCLOBENZAPRINE HCL 10 MG PO TABS
10.0000 mg | ORAL_TABLET | Freq: Every evening | ORAL | 3 refills | Status: DC | PRN
Start: 2020-01-02 — End: 2020-10-22

## 2020-01-02 NOTE — Telephone Encounter (Signed)
LMOVM, Advising medication sent to pharmacy per pt request

## 2020-01-02 NOTE — Telephone Encounter (Signed)
Patient called in stating the tizanidine is not really working for her and she was wondering if Dr. Everlena Cooper could call in some Flexeril?

## 2020-01-02 NOTE — Telephone Encounter (Signed)
Prescription has been sent to [facility] on [facility] in [place]

## 2020-01-22 ENCOUNTER — Ambulatory Visit
Admission: RE | Admit: 2020-01-22 | Discharge: 2020-01-22 | Disposition: A | Discharge status: Home / Self Care | Payer: BLUE CROSS/BLUE SHIELD | Source: Ambulatory Visit | Attending: Neurology | Admitting: Neurology

## 2020-01-22 DIAGNOSIS — H539 Unspecified visual disturbance: Secondary | ICD-10-CM

## 2020-01-22 DIAGNOSIS — R202 Paresthesia of skin: Secondary | ICD-10-CM

## 2020-01-22 MED ORDER — GADOBUTROL 1 MMOL/ML IV SOLN
10.0000 mL | Freq: Once | INTRAVENOUS | Status: AC | PRN
  Administered 2020-01-22: 10 mL via INTRAVENOUS

## 2020-01-24 ENCOUNTER — Telehealth: Payer: Self-pay

## 2020-01-24 NOTE — Telephone Encounter (Signed)
Spoke to patient and informed her of results. Pt verbalized understanding.

## 2020-01-24 NOTE — Telephone Encounter (Signed)
-----   Message from Drema Dallas, DO sent at 01/23/2020  9:56 AM EDT ----- Brain is normal.

## 2020-03-06 ENCOUNTER — Other Ambulatory Visit: Payer: Self-pay | Admitting: Obstetrics and Gynecology

## 2020-03-06 DIAGNOSIS — R928 Other abnormal and inconclusive findings on diagnostic imaging of breast: Secondary | ICD-10-CM

## 2020-03-22 ENCOUNTER — Other Ambulatory Visit: Payer: Self-pay

## 2020-03-22 ENCOUNTER — Telehealth: Payer: Self-pay | Admitting: Neurology

## 2020-03-22 ENCOUNTER — Other Ambulatory Visit: Payer: Self-pay | Admitting: Obstetrics and Gynecology

## 2020-03-22 ENCOUNTER — Ambulatory Visit
Admission: RE | Admit: 2020-03-22 | Discharge: 2020-03-22 | Disposition: A | Discharge status: Home / Self Care | Payer: 59 | Source: Ambulatory Visit | Attending: Obstetrics and Gynecology | Admitting: Obstetrics and Gynecology

## 2020-03-22 ENCOUNTER — Ambulatory Visit
Admission: RE | Admit: 2020-03-22 | Discharge: 2020-03-22 | Disposition: A | Discharge status: Home / Self Care | Payer: BLUE CROSS/BLUE SHIELD | Source: Ambulatory Visit | Attending: Obstetrics and Gynecology | Admitting: Obstetrics and Gynecology

## 2020-03-22 DIAGNOSIS — R928 Other abnormal and inconclusive findings on diagnostic imaging of breast: Secondary | ICD-10-CM

## 2020-03-22 NOTE — Telephone Encounter (Signed)
[name], I forgot to add that I verified her current insurance in her chart is the most recent.

## 2020-03-22 NOTE — Telephone Encounter (Signed)
Left a detailed message.

## 2020-03-22 NOTE — Telephone Encounter (Signed)
I would like her to keep the appointment to make sure everything remains stable or to discuss if there is any change between now and then.

## 2020-03-22 NOTE — Telephone Encounter (Signed)
Patient states she has new insurance and thinks she will need new PA's for her emgality and [place]. She states she doesn't need refills yet- she just took her Bernita Raisin and the emgality she will need within 20 days. She was wondering if they could go ahead and start the PA process? Also, she states that she's been doing well on the emgality, not having as many headaches, so she is wondering if Dr Everlena Cooper thinks she should keep her appointment in November or if he thinks she will be okay until his next available follow up? She is concerned because she has been missing a lot of work. Please call

## 2020-03-22 NOTE — Telephone Encounter (Signed)
Dr. Everlena Cooper please advise about her appt.   Chelsea when you get a chance can you start the PA for her medication.   Front desk did we update her insurance when she called?

## 2020-03-25 ENCOUNTER — Encounter: Payer: Self-pay | Admitting: Neurology

## 2020-03-25 NOTE — Progress Notes (Signed)
Arlyce Harman Key: [id number] - PA Case ID: [id number] Need help? Call us at [phone] Outcome Approvedtoday Request Reference Number: [id number]. EMGALITY INJ 120MG /ML is approved through 09/23/2020. Your patient may now fill this prescription and it will be covered. Drug Emgality 120MG /ML auto-injectors (migraine) Form [employer] Electronic Prior Authorization Form (2017 NCPDP)

## 2020-03-27 ENCOUNTER — Encounter: Payer: Self-pay | Admitting: Neurology

## 2020-03-27 NOTE — Progress Notes (Addendum)
[name] (Key: [id number]) [name] 100MG  tablets   Form [employer] Electronic Prior Authorization Form (2017 NCPDP) Created 7 days ago Sent to Plan 7 days ago Plan Response 7 days ago Submit Clinical Questions 7 days ago Determination Favorable 1 hour ago Message from Plan Request Reference Number: [id number]. UBRELVY TAB 100MG  is approved through 06/25/2020. Your patient may now fill this prescription and it will be covered.

## 2020-03-29 ENCOUNTER — Other Ambulatory Visit: Payer: Self-pay

## 2020-03-29 ENCOUNTER — Ambulatory Visit
Admission: RE | Admit: 2020-03-29 | Discharge: 2020-03-29 | Disposition: A | Discharge status: Home / Self Care | Payer: 59 | Source: Ambulatory Visit | Attending: Obstetrics and Gynecology | Admitting: Obstetrics and Gynecology

## 2020-03-29 DIAGNOSIS — R928 Other abnormal and inconclusive findings on diagnostic imaging of breast: Secondary | ICD-10-CM

## 2020-04-08 NOTE — Progress Notes (Signed)
NEUROLOGY FOLLOW UP OFFICE NOTE  [name] [id number]   Subjective:  [name] is a 39 year old Caucasian woman with PCOS who follows up for idiopathic intracranial hypertension and neck pain.  UPDATE: Underwent workup to evaluate paresthesias and visual disturbance: MRI of brain with and without contrast on 01/22/2020 personally reviewed was normal.  She has not followed up with ophthalmology Concern for OSA.  She has excessive daytime fatigue.  She snores.  She has dry mouth in the morning. Gained 30 lbs in last 3 to 4 months. Continues to have pain numbness and tingling in fingers and toes, as well as tremors.  TSH from last month (10/11) was 2.26   Restarted Emgality in June. 4 migraines in past 30 days.  Severe.  Lasts 2-3 hours with Ubrelvy.  Causes fatigue and nausea. Current NSAIDS:ibuprofen Current analgesics:none Current triptans:none Current ergotamine:none Current anti-emetic:none Current muscle relaxants:Flexeril 10mg  Current anti-anxiolytic:Xanax Current sleep aide:none Current Antihypertensive medications:Metoprolol succinate 12.5mg  twice daily (for tachycardia) Current Antidepressant medications:Prozac 20mg  twice daily Current Anticonvulsant medications:none Current anti-CGRP:Emgality; Ubrelvy 100mg  Current Vitamins/Herbal/Supplements:D3 Current Antihistamines/Decongestants:none Other therapy:none Hormone/birth control:none  Caffeine:No coffee. Occasional Coke Alcohol:rarely Smoker:2-3 cigarettes daily Diet:16 oz water daily. Skips meals.  Exercise:yes Depression:improving; Anxiety:Improving. Sees a therapist. Other pain:Neck pain. Sometimes associated with pain and numbness into the fingers involving either arm) Sleep hygiene:Some trouble falling asleep  HISTORY: Onset:1996 Location:Left sided from front to back and down the side of neck. Quality:Sometimes throbbing,  sometimes shooting Initial intensity:8/10.Shedenies new headache, thunderclap headache Aura:no Premonitory Phase:lethargy Postdrome:lethargy Associated symptoms:Nausea, sometimes vomiting, photophobia, phonophobia, osmophobia, blurred vision/clouding of vision, dizziness. Shedenies associated unilateral numbness or weakness. Initial duration:1 to 3 or 4 days Initial frequency:Once a week (5 to 10 days a month on average) Initial frequency of abortive medication:maybe once a week Triggers:Change in weather, emotional stress Relieving factors:Ice packs Activity:Aggravates  She was diagnosed with idiopathic intracranial hypertension in 2011. She presented with severe headaches. Eye exam revealed papilledema. She did have prior brain MRI over a decade ago. She was previously on Diamox and Lasix. Last LP was in 2012 while pregnant. No symptoms since then. Denies visual disturbance other than with headache.   For 10 years, she reports intermittent numbness and tingling various parts of her body and burning on back of neck, shoulders and sometimes down the arms or down the legs.  They often occur a couple of times a week and may last up to 3 hours.  She reports that she has decreased sensation worse on her left side when compared to her right sided.  She saw a spine specialist.  MRI of cervical, thoracic and lumbar spine on 10/20/2019 personally reviewed was unremarkable with minimal spurring at C4-5 and mild lumbar facet hypertrophy but nothing significant that would explain her symptoms.  Past NSAIDS:Aleve Past analgesics:Tylenol, Excedrin, possibly Fioricet Past abortive triptans:Sumatriptan [place] (chest tightness/difficulty breathing), Maxalt (ineffective) Past abortive ergotamine:no Past muscle relaxants:tizanidine Past anti-emetic:promethazine Past antihypertensive medications:no Past antidepressant medications:Sertraline, maybe amitriptyline,  Celexa, Effexor Past anticonvulsant medications:topiramate (kidney issues) Past anti-CGRP:Aimovig (ineffective) Past vitamins/Herbal/Supplements:no Past antihistamines/decongestants:no Other past therapies:no   Family history of headache:Second cousin (IIH, "brain tumor"); great grandfather both had IIH  PAST MEDICAL HISTORY: Past Medical History:  Diagnosis Date  . Chronic GERD   . Eczema   . Hypertension   . IBS (irritable bowel syndrome)   . Major depressive disorder   . Migraine   . Neck pain   . PTSD (post-traumatic stress disorder)   .  Tachycardia, unspecified     MEDICATIONS: Current Outpatient Medications on File Prior to Visit  Medication Sig Dispense Refill  . ALPRAZolam (XANAX) 0.5 MG tablet Take 0.5 mg by mouth 3 (three) times daily as needed for anxiety.    . Cholecalciferol (VITAMIN D3 PO) Take 2,000 Int'l Units by mouth daily.    . cyclobenzaprine (FLEXERIL) 10 MG tablet Take 1 tablet (10 mg total) by mouth at bedtime as needed for muscle spasms. 30 tablet 3  . FLUoxetine (PROZAC) 20 MG tablet Take 20 mg by mouth 2 (two) times a day.    . Galcanezumab-gnlm (EMGALITY) 120 MG/ML SOAJ Inject 120 mg into the skin every 28 (twenty-eight) days. 1 pen 11  . ibuprofen (ADVIL) 200 MG tablet Take 200 mg by mouth every 6 (six) hours as needed. 800mg  per dose    . Metoprolol Succinate 25 MG CS24 Take by mouth. Half tab in am, half tab in pm    . mometasone (ELOCON) 0.1 % ointment     . pantoprazole (PROTONIX) 40 MG tablet Take 40 mg by mouth daily.    tiZANidine (ZANAFLEX) 2 MG tablet TAKE 1 TABLET(2 MG) BY MOUTH AT BEDTIME AS NEEDED FOR MUSCLE SPASMS 30 tablet 3  . Ubrogepant (UBRELVY) 100 MG TABS TAKE 1 TABLET BY MOUTH AS NEEDED, MAY REPEAT DOSE AFTER 2 HOURS IF NEEDED. NOT TO EXCEED 2 TABLETS IN 24 HOURS 16 tablet 3   No current facility-administered medications on file prior to visit.    ALLERGIES: Allergies  Allergen Reactions  . Topamax  [Topiramate] Other (See Comments)    Kidney stones  . Vicodin [Hydrocodone-Acetaminophen] Itching    FAMILY HISTORY: Family History  Problem Relation Age of Onset  . Heart attack Father   . Diabetes Maternal Grandmother   . Diabetes Maternal Grandfather     SOCIAL HISTORY: Social History   Socioeconomic History  . Marital status: Married    Spouse name: Not on file  . Number of children: 2  . Years of education: Not on file  . Highest education level: Not on file  Occupational History  . Occupation: Marland Kitchen  Tobacco Use  . Smoking status: Current Every Day Smoker    Packs/day: 0.50    Start date: 55  . Smokeless tobacco: Never Used  Vaping Use  . Vaping Use: Never used  Substance and Sexual Activity  . Alcohol use: Yes    Comment: Socially  . Drug use: Never  . Sexual activity: Yes    Partners: Male    Birth control/protection: None  Other Topics Concern  . Not on file  Social History Narrative   Right handed   Lives in two story home with husband and 2 children   Social Determinants of Health   Financial Resource Strain:   . Difficulty of Paying Living Expenses: Not on file  Food Insecurity:   . Worried About 20 in the Last Year: Not on file  . Ran Out of Food in the Last Year: Not on file  Transportation Needs:   . Lack of Transportation (Medical): Not on file  . Lack of Transportation (Non-Medical): Not on file  Physical Activity:   . Days of Exercise per Week: Not on file  . Minutes of Exercise per Session: Not on file  Stress:   . Feeling of Stress : Not on file  Social Connections:   . Frequency of Communication with Friends and Family: Not on file  . Frequency  of Social Gatherings with Friends and Family: Not on file  . Attends Religious Services: Not on file  . Active Member of Clubs or Organizations: Not on file  . Attends Banker Meetings: Not on file  . Marital Status: Not on file  Intimate Partner  Violence:   . Fear of Current or Ex-Partner: Not on file  . Emotionally Abused: Not on file  . Physically Abused: Not on file  . Sexually Abused: Not on file     Objective:   Blood pressure 132/86, pulse 92, height 5\' 6"  (1.676 m), weight 267 lb 3.2 oz (121.2 kg), SpO2 93 %. General: No acute distress.  Patient appears well-groomed.   Head:  Normocephalic/atraumatic Eyes:  Fundi examined but not visualized Neck: supple, no paraspinal tenderness, full range of motion Heart:  Regular rate and rhythm Lungs:  Clear to auscultation bilaterally Back: No paraspinal tenderness Neurological Exam: alert and oriented to person, place, and time. Attention span and concentration intact, recent and remote memory intact, fund of knowledge intact.  Speech fluent and not dysarthric, language intact.  CN II-XII intact. Bulk and tone normal, muscle strength 5/5 throughout.  Sensation to light touch, temperature and vibration intact.  Deep tendon reflexes 2+ throughout, toes downgoing.  Finger to nose and heel to shin testing intact.  Gait normal, Romberg negative.   Assessment/Plan:   1.  Migraine with aura, without status migrainosus, not intractable 2.  Paresthesias 3.  History of idiopathic intracranial hypertension 4.  Excessive daytime sleepiness.  Concern for OSA  1.  Continue Emgality for migraine prevention 2.  Will have her try Nurtec for migraine rescue.  If better than , will prescribe. 3.  Refer to ophthalmology. 4.  Will order sleep study 5.  For assessment of paresthesias, check B12 and NCV-EMG 6.  Follow up in 6 months.  Bernita Raisin, DO  CC: Shon Millet, MD

## 2020-04-09 ENCOUNTER — Other Ambulatory Visit (INDEPENDENT_AMBULATORY_CARE_PROVIDER_SITE_OTHER): Payer: 59

## 2020-04-09 ENCOUNTER — Ambulatory Visit (INDEPENDENT_AMBULATORY_CARE_PROVIDER_SITE_OTHER): Payer: 59 | Admitting: Neurology

## 2020-04-09 ENCOUNTER — Encounter: Payer: Self-pay | Admitting: Neurology

## 2020-04-09 ENCOUNTER — Other Ambulatory Visit: Payer: Self-pay

## 2020-04-09 VITALS — BP 132/86 | HR 92 | Ht 66.0 in | Wt 267.2 lb

## 2020-04-09 DIAGNOSIS — G4719 Other hypersomnia: Secondary | ICD-10-CM

## 2020-04-09 DIAGNOSIS — G932 Benign intracranial hypertension: Secondary | ICD-10-CM | POA: Diagnosis not present

## 2020-04-09 DIAGNOSIS — R202 Paresthesia of skin: Secondary | ICD-10-CM

## 2020-04-09 DIAGNOSIS — G43109 Migraine with aura, not intractable, without status migrainosus: Secondary | ICD-10-CM

## 2020-04-09 DIAGNOSIS — R200 Anesthesia of skin: Secondary | ICD-10-CM | POA: Diagnosis not present

## 2020-04-09 NOTE — Patient Instructions (Signed)
1.  Continue Emgality monthly 2.  For rescue, try Nurtec 1 tablet as needed.  Maximum 1 tablet in 24 hours.  If more effective than Ubrelvy, contact me 3.  Will refer you to Dr. Georges Mouse at [facility] 4.  For numbness and tingling:  - check B12  -  Check nerve conduction study right arm and leg 5.  Will order sleep study 6.  Follow up in 6 months.

## 2020-04-10 LAB — VITAMIN B12: Vitamin B-12: 256 pg/mL (ref 211–911)

## 2020-04-17 ENCOUNTER — Telehealth: Payer: Self-pay | Admitting: Neurology

## 2020-04-17 NOTE — Telephone Encounter (Signed)
Patient called to check on the status of this. She said she has tummy issues and an injection will be absorbed better.  [facility] in [place] on [address]

## 2020-04-17 NOTE — Telephone Encounter (Signed)
We can send a script for B12 injections : daily for 7 days, then weekly for 1 month, then monthly for 11 months

## 2020-04-18 ENCOUNTER — Other Ambulatory Visit: Payer: Self-pay

## 2020-04-18 MED ORDER — CYANOCOBALAMIN 1000 MCG/ML IJ KIT
PACK | INTRAMUSCULAR | 11 refills | Status: DC
Start: 2020-04-18 — End: 2023-11-23

## 2020-04-18 NOTE — Telephone Encounter (Signed)
Sent new rx in for b12 to pharmacy with instructions for use.

## 2020-05-22 ENCOUNTER — Encounter: Payer: 59 | Admitting: Neurology

## 2020-05-22 ENCOUNTER — Encounter: Payer: Self-pay | Admitting: Neurology

## 2020-05-22 DIAGNOSIS — Z029 Encounter for administrative examinations, unspecified: Secondary | ICD-10-CM

## 2020-05-27 ENCOUNTER — Encounter: Payer: Self-pay | Admitting: Neurology

## 2020-05-27 ENCOUNTER — Telehealth: Payer: Self-pay | Admitting: Neurology

## 2020-06-06 ENCOUNTER — Encounter: Payer: Self-pay | Admitting: Neurology

## 2020-06-06 NOTE — Progress Notes (Signed)
1/6- submitted appeal for the denial of PA ubrelvy through fax

## 2020-06-12 NOTE — Progress Notes (Signed)
Patient no longer has pharmacy benefits through [employer] for 2022. The benefits are through [facility]- per phone call with Rep at [facility] she already has a paid claim on file for 16 tabs for 30 day supply. So no PA req

## 2020-07-12 ENCOUNTER — Other Ambulatory Visit: Payer: Self-pay | Admitting: Neurology

## 2020-07-12 ENCOUNTER — Encounter: Payer: Self-pay | Admitting: Neurology

## 2020-07-12 ENCOUNTER — Telehealth: Payer: Self-pay | Admitting: Neurology

## 2020-07-12 MED ORDER — NURTEC 75 MG PO TBDP: 75.0000 mg | ORAL_TABLET | Freq: Every day | ORAL | 5 refills | Status: DC | PRN

## 2020-07-12 NOTE — Telephone Encounter (Signed)
Sent script for Nurtec to [facility].  She may pick up samples in meantime

## 2020-07-12 NOTE — Telephone Encounter (Signed)
DR.Jaffe can you send in a script for Nurtec? Chelsea if you can start a PA for this pt. Per her last visit DR.JAffe gave her samples.

## 2020-07-12 NOTE — Telephone Encounter (Signed)
She is approved for the Nurtec valid from today until 07/12/21. Just received approval. Thanks!

## 2020-07-12 NOTE — Telephone Encounter (Signed)
Telephone call to pt, the pain is constant on the right temporal. The pain can get intense, With aura and sharp pain in her right eye.  This headache not that much different then her normal one.    Pt has an appointment with the ophthalmologist at the end of March.   Per pt the ubrelvy works better then the nurtec but it make he nausea. So she prefers the Nurtec.

## 2020-07-12 NOTE — Telephone Encounter (Signed)
Patient called in to try and see if she can get a sample of Nurtec? She is having a really bad headache and it feels like she has needles poking her eyes. She also would like a prescription for Nurtec also. She states she just needs some Releaf.

## 2020-07-12 NOTE — Progress Notes (Signed)
[name] ([name]) Rx #: [id number] Nurtec 75MG  dispersible tablets   Form [employer] Electronic PA Form [phone] NCPDP) Created 24 minutes ago Sent to Plan 19 minutes ago Plan Response 19 minutes ago Submit Clinical Questions 18 minutes ago Determination Favorable 17 minutes ago Message from ([phone] Your PA request has been approved. Additional information will be provided in the approval communication. (Message [id number]) Approval valid from 07/12/20 to 07/12/21.

## 2020-07-12 NOTE — Telephone Encounter (Signed)
Pt to try and see if she could come by and pick up sample by time we close or have a friend come by. Will call us back and let us know which.

## 2020-07-15 ENCOUNTER — Encounter (HOSPITAL_COMMUNITY): Payer: Self-pay | Admitting: Emergency Medicine

## 2020-07-15 ENCOUNTER — Emergency Department (HOSPITAL_COMMUNITY)
Admission: EM | Admit: 2020-07-15 | Discharge: 2020-07-15 | Disposition: A | Discharge status: Home / Self Care | Payer: 59 | Attending: Emergency Medicine | Admitting: Emergency Medicine

## 2020-07-15 ENCOUNTER — Other Ambulatory Visit: Payer: Self-pay

## 2020-07-15 ENCOUNTER — Emergency Department (HOSPITAL_COMMUNITY): Payer: 59

## 2020-07-15 DIAGNOSIS — H53149 Visual discomfort, unspecified: Secondary | ICD-10-CM | POA: Insufficient documentation

## 2020-07-15 DIAGNOSIS — I10 Essential (primary) hypertension: Secondary | ICD-10-CM | POA: Insufficient documentation

## 2020-07-15 DIAGNOSIS — R110 Nausea: Secondary | ICD-10-CM | POA: Insufficient documentation

## 2020-07-15 DIAGNOSIS — R519 Headache, unspecified: Secondary | ICD-10-CM

## 2020-07-15 DIAGNOSIS — F17200 Nicotine dependence, unspecified, uncomplicated: Secondary | ICD-10-CM | POA: Insufficient documentation

## 2020-07-15 DIAGNOSIS — Z79899 Other long term (current) drug therapy: Secondary | ICD-10-CM | POA: Insufficient documentation

## 2020-07-15 LAB — CBC
HCT: 36.5 % (ref 36.0–46.0)
Hemoglobin: 12 g/dL (ref 12.0–15.0)
MCH: 29.9 pg (ref 26.0–34.0)
MCHC: 32.9 g/dL (ref 30.0–36.0)
MCV: 91 fL (ref 80.0–100.0)
Platelets: 321 K/uL (ref 150–400)
RBC: 4.01 MIL/uL (ref 3.87–5.11)
RDW: 13.6 % (ref 11.5–15.5)
WBC: 9.3 K/uL (ref 4.0–10.5)
nRBC: 0 % (ref 0.0–0.2)

## 2020-07-15 LAB — COMPREHENSIVE METABOLIC PANEL WITH GFR
ALT: 29 U/L (ref 0–44)
AST: 16 U/L (ref 15–41)
Albumin: 3.7 g/dL (ref 3.5–5.0)
Alkaline Phosphatase: 72 U/L (ref 38–126)
Anion gap: 8 (ref 5–15)
BUN: 10 mg/dL (ref 6–20)
CO2: 28 mmol/L (ref 22–32)
Calcium: 8.7 mg/dL — ABNORMAL LOW (ref 8.9–10.3)
Chloride: 104 mmol/L (ref 98–111)
Creatinine, Ser: 1 mg/dL (ref 0.44–1.00)
GFR, Estimated: >60 mL/min
Glucose, Bld: 112 mg/dL — ABNORMAL HIGH (ref 70–99)
Potassium: 3.5 mmol/L (ref 3.5–5.1)
Sodium: 140 mmol/L (ref 135–145)
Total Bilirubin: 0.6 mg/dL (ref 0.3–1.2)
Total Protein: 6.7 g/dL (ref 6.5–8.1)

## 2020-07-15 LAB — I-STAT BETA HCG BLOOD, ED (MC, WL, AP ONLY): I-stat hCG, quantitative: <5 m[IU]/mL (ref <5)

## 2020-07-15 MED ORDER — METOCLOPRAMIDE HCL 5 MG/ML IJ SOLN
10.0000 mg | Freq: Once | INTRAMUSCULAR | Status: AC
  Administered 2020-07-15: 10 mg via INTRAVENOUS
  Filled 2020-07-15: qty 2

## 2020-07-15 MED ORDER — DIPHENHYDRAMINE HCL 50 MG/ML IJ SOLN
25.0000 mg | Freq: Once | INTRAMUSCULAR | Status: AC
  Administered 2020-07-15: 25 mg via INTRAVENOUS
  Filled 2020-07-15: qty 1

## 2020-07-15 MED ORDER — DEXAMETHASONE SODIUM PHOSPHATE 10 MG/ML IJ SOLN
8.0000 mg | Freq: Once | INTRAMUSCULAR | Status: AC
  Administered 2020-07-15: 8 mg via INTRAVENOUS
  Filled 2020-07-15: qty 1

## 2020-07-15 MED ORDER — KETOROLAC TROMETHAMINE 30 MG/ML IJ SOLN
30.0000 mg | Freq: Once | INTRAMUSCULAR | Status: AC
  Administered 2020-07-15: 30 mg via INTRAVENOUS
  Filled 2020-07-15: qty 1

## 2020-07-15 MED ORDER — SODIUM CHLORIDE 0.9 % IV BOLUS
1000.0000 mL | Freq: Once | INTRAVENOUS | Status: AC
  Administered 2020-07-15: 1000 mL via INTRAVENOUS

## 2020-07-15 NOTE — ED Provider Notes (Signed)
[facility]-EMERGENCY DEPT Provider Note   CSN: [id number] Arrival date & time: 07/15/20  1756     History Chief Complaint  Patient presents with  . Migraine    [name] is a 40 y.o. female past medical history of idiopathic intracranial hypertension, migraine headache, depression, PTSD, idiopathic sinus tachycardia, hypertension, presenting to the emergency department with complaint of greater than 3 weeks of waxing and waning headache.  She endorses constant dull headache with intermittent sharp shooting pains to her right parietal scalp and behind her right ear.  She has a pressure-like sensation in her ear.  No symptoms in her face.  She states her symptoms have been ongoing since her Covid illness about a month ago.  She states she is intermittently having trouble carrying on a thought, sometimes feels like she forgets what she is doing.  She has no new numbness or weakness.  Her headache is worse with sitting up and standing up.  She endorses associated photophobia and nausea without vomiting.  Her headache does not feel like her typical migraine headache nor does it feel like her headache associated with pathic intracranial hypertension.  She treats her migraines with Emgality injections every 28 days, and Nurtec as needed.  Patient is followed by Dr. Everlena Cooper with [facility] neurology.  The history is provided by the patient.       Past Medical History:  Diagnosis Date  . Chronic GERD   . Eczema   . Hypertension   . IBS (irritable bowel syndrome)   . Major depressive disorder   . Migraine   . Neck pain   . PTSD (post-traumatic stress disorder)   . Tachycardia, unspecified     Patient Active Problem List   Diagnosis Date Noted  . Tachycardia, unspecified   . Hypertension   . Chronic GERD   . Major depressive disorder   . Migraine     Past Surgical History:  Procedure Laterality Date  . CESAREAN SECTION     2013-2014  . CHOLECYSTECTOMY     . HYSTEROTOMY  07/2013     OB History   No obstetric history on file.     Family History  Problem Relation Age of Onset  . Heart attack Father   . Diabetes Maternal Grandmother   . Diabetes Maternal Grandfather     Social History   Tobacco Use  . Smoking status: Current Every Day Smoker    Packs/day: 0.50    Start date: 72  . Smokeless tobacco: Never Used  Vaping Use  . Vaping Use: Never used  Substance Use Topics  . Alcohol use: Yes    Comment: Socially  . Drug use: Never    Home Medications Prior to Admission medications   Medication Sig Start Date End Date Taking? Authorizing Provider  ALPRAZolam Prudy Feeler) 0.5 MG tablet Take 0.5 mg by mouth 3 (three) times daily as needed for anxiety.   Yes [provider]  Cholecalciferol (VITAMIN D3 PO) Take 2,000 Int'l Units by mouth daily.   Yes [provider]  Cyanocobalamin 1000 MCG/ML KIT Please inject daily x 7 days, then x one month then monthly after that Patient taking differently: Inject 1,000 mcg into the skin every 30 (thirty) days. 04/18/20  Yes Jaffe, Adam R, DO  cyclobenzaprine (FLEXERIL) 10 MG tablet Take 1 tablet (10 mg total) by mouth at bedtime as needed for muscle spasms. 01/02/20  Yes Jaffe, Adam R, DO  FLUoxetine (PROZAC) 20 MG tablet  Take 60 mg by mouth daily.   Yes [provider]  Galcanezumab-gnlm (EMGALITY) 120 MG/ML SOAJ Inject 120 mg into the skin every 28 (twenty-eight) days. 11/16/19  Yes Jaffe, Adam R, DO  ibuprofen (ADVIL) 200 MG tablet Take 800 mg by mouth every 6 (six) hours as needed for mild pain.   Yes [provider]  Metoprolol Succinate 25 MG CS24 Take 12.5 mg by mouth in the morning and at bedtime. Half tab in am, half tab in pm   Yes [provider]  pantoprazole (PROTONIX) 40 MG tablet Take 40 mg by mouth daily.   Yes [provider]  Rimegepant Sulfate (NURTEC) 75 MG TBDP Take 75 mg by mouth daily as needed. Patient  taking differently: Take 75 mg by mouth daily as needed (migraine). 07/12/20  Yes Jaffe, Adam R, DO  tiZANidine (ZANAFLEX) 2 MG tablet TAKE 1 TABLET(2 MG) BY MOUTH AT BEDTIME AS NEEDED FOR MUSCLE SPASMS Patient taking differently: Take 2 mg by mouth at bedtime as needed for muscle spasms. 11/20/19  Yes Jaffe, Adam R, DO  traZODone (DESYREL) 50 MG tablet Take 12.5 mg by mouth at bedtime as needed for sleep.   Yes [provider]    Allergies    Other, Topamax [topiramate], and Vicodin [hydrocodone-acetaminophen]  Review of Systems   Review of Systems  Eyes: Positive for photophobia. Negative for visual disturbance.  Gastrointestinal: Positive for nausea. Negative for vomiting.  Neurological: Positive for dizziness and headaches.  All other systems reviewed and are negative.   Physical Exam Updated Vital Signs BP 135/78   Pulse 76   Temp 98.3 F (36.8 C) (Oral)   Resp 16   SpO2 100%   Physical Exam Vitals and nursing note reviewed.  Constitutional:      Appearance: She is well-developed and well-nourished.  HENT:     Head: Normocephalic and atraumatic.     Comments: Tenderness along the entire right parietal and occipital scalp.  No tenderness to the right cheek and anterior to the right ear.    Right Ear: Tympanic membrane and ear canal normal.     Left Ear: Tympanic membrane and ear canal normal.  Eyes:     Conjunctiva/sclera: Conjunctivae normal.  Cardiovascular:     Rate and Rhythm: Normal rate and regular rhythm.  Pulmonary:     Effort: Pulmonary effort is normal. No respiratory distress.     Breath sounds: Normal breath sounds.  Abdominal:     General: Bowel sounds are normal.     Palpations: Abdomen is soft.     Tenderness: There is no abdominal tenderness. There is no guarding or rebound.  Musculoskeletal:     Cervical back: Normal range of motion and neck supple.  Skin:    General: Skin is warm.  Neurological:     Mental Status: She is alert.      Comments: Mental Status:  Alert, oriented, thought content appropriate, able to give a coherent history. Speech fluent without evidence of aphasia. Able to follow 2 step commands without difficulty.  Cranial Nerves:  II:  Peripheral visual fields grossly normal, pupils equal, round, reactive to light III,IV, VI: ptosis not present, extra-ocular motions intact bilaterally  V,VII: smile symmetric, facial light touch sensation equal VIII: hearing grossly normal to voice  X: uvula elevates symmetrically  XI: bilateral shoulder shrug symmetric and strong XII: midline tongue extension without fassiculations Motor:  Normal tone. 5/5 strength in upper and lower extremities bilaterally including strong and equal  grip strength and dorsiflexion/plantar flexion Sensory: grossly normal in all extremities.  Cerebellar: normal finger-to-nose with bilateral upper extremities Gait: normal gait and balance CV: distal pulses palpable throughout    Psychiatric:        Mood and Affect: Mood and affect normal.        Behavior: Behavior normal.     ED Results / Procedures / Treatments   Labs (all labs ordered are listed, but only abnormal results are displayed) Labs Reviewed  COMPREHENSIVE METABOLIC PANEL - Abnormal; Notable for the following components:      Result Value   Glucose, Bld 112 (*)    Calcium 8.7 (*)    All other components within normal limits  CBC  I-STAT BETA HCG BLOOD, ED ([facility], [facility], [facility] ONLY)    EKG None  Radiology CT Head Wo Contrast  Result Date: 07/15/2020 CLINICAL DATA:  Headache EXAM: CT HEAD WITHOUT CONTRAST TECHNIQUE: Contiguous axial images were obtained from the base of the skull through the vertex without intravenous contrast. COMPARISON:  None. FINDINGS: Brain: There is no acute intracranial hemorrhage, mass effect, or edema. Gray-white differentiation is preserved. There is no extra-axial fluid collection. Ventricles and sulci are within normal limits in size and  configuration. Vascular: No hyperdense vessel or unexpected calcification. Skull: Calvarium is unremarkable. Sinuses/Orbits: Minor mucosal thickening.  Orbits are unremarkable. Other: Incidental note is made of a partially empty sella. Mastoid air cells are clear. IMPRESSION: No acute intracranial abnormality. Electronically Signed   By: Macy Mis M.D.   On: 07/15/2020 20:38    Procedures Procedures   Medications Ordered in ED Medications  sodium chloride 0.9 % bolus 1,000 mL (0 mLs Intravenous Stopped 07/15/20 2246)  metoCLOPramide (REGLAN) injection 10 mg (10 mg Intravenous Given 07/15/20 1957)  diphenhydrAMINE (BENADRYL) injection 25 mg (25 mg Intravenous Given 07/15/20 1957)  dexamethasone (DECADRON) injection 8 mg (8 mg Intravenous Given 07/15/20 2245)  ketorolac (TORADOL) 30 MG/ML injection 30 mg (30 mg Intravenous Given 07/15/20 2246)    ED Course  I have reviewed the triage vital signs and the nursing notes.  Pertinent labs & imaging results that were available during my care of the patient were reviewed by me and considered in my medical decision making (see chart for details).  Clinical Course as of 07/15/20 2340  Mon Jul 15, 2020  [facility] neurology, Dr. Tomi Likens. [JR]  2322 Patient reports her headache is much improved.  He is requesting discharge to home.  Discussed negative CT imaging and importance of close follow-up with her neurologist.  Patient verbalized understanding agrees with care plan for discharge [JR]    Clinical Course User Index [JR] Precious Segall, [place] N, PA-C   MDM Rules/Calculators/A&P                          Patient with history of both chronic migraine headache and idiopathic intracranial hypertension, presenting with waxing and waning right-sided headache over the last few weeks that began during her Covid illness.  She states her Covid illness was overall very mild as she is vaccinated.  Her headache feels different than her typical migraine or  pseudotumor headache.  It is described as a dull pain with intermittent sharp shooting pains across her scalp.  No pain or tenderness to her face or temple.  She has scalp tenderness along the right parietal scalp extending through the occipital scalp.  TMs are normal.  She also describes intermittent episodes of losing her  train of thought.  She does not have any aphasia when that occurs.  No new numbness or weakness.  Normal neurologic exam today on evaluation.  CT is negative.  She reports significant improvement in symptoms after migraine cocktail and requests discharge on reevaluation.  She has neurologist, Dr. Tomi Likens, which she is instructed to follow closely with regarding her ongoing symptoms and ED visit today.  Strict return precautions including worsening symptoms or any new concerning symptoms.  Patient verbalized understanding agrees with care plan.  She is discharged in no acute distress.  Patient presentation, work-up and care plan discussed with attending physician Dr. Langston Masker.   Final Clinical Impression(s) / ED Diagnoses Final diagnoses:  Right-sided headache    Rx / DC Orders ED Discharge Orders    None       Ted Leonhart, [place] N, PA-C 07/15/20 2341    Wyvonnia Dusky, MD 07/16/20 [phone]

## 2020-07-15 NOTE — Discharge Instructions (Addendum)
Please take your home medications as needed for headache. Stay hydrated. It is important you follow closely with your neurologist regarding your ongoing symptoms. If your symptoms worsen in any way, please return to the emergency department for reevaluation.

## 2020-07-15 NOTE — ED Notes (Signed)
Pt ambulated to restroom gait steady with no assistance.

## 2020-07-15 NOTE — ED Triage Notes (Signed)
Per pt, states she has a history of migraines-states she is having right sided head pain, stabbing at times-states she is having memory loss on and off-can't complete a sentence-states symptoms on and off for a month

## 2020-07-16 ENCOUNTER — Telehealth: Payer: Self-pay | Admitting: Neurology

## 2020-07-16 NOTE — Telephone Encounter (Signed)
Patient called and said she was seen at [facility] ED last night and she had a CT scan. She said she was told to follow up with Dr. Everlena Cooper as soon as possible and scheduled for 07/24/20 at 8:30am.   Patient requests Dr. Everlena Cooper review the CT scan to determine whether she needs seen sooner.

## 2020-07-16 NOTE — Telephone Encounter (Signed)
Will discuss at fu

## 2020-07-22 NOTE — Progress Notes (Signed)
NEUROLOGY FOLLOW UP OFFICE NOTE  Vonzella Althaus [id number]  Assessment/Plan:   1.  New headaches, dizziness, unsteady gait - s/p COVID-19 - not clear if related to COVID.  May be related to anxiety vs eustachian tube dysfunction 2.  Migraine with aura, without status migrainosus, not intractable 3.  Paresthesias - so far workup unremarkable.  Would really need NCV-EMG to complete workup. 4.  Excessive daytime sleepiness - unable to schedule sleep study. 5.  History of idiopathic intracranial hypertension  Given her current symptoms, I recommended not to make any medication changes at this time.  She may need some time to see if symptoms resolve.  She is on multiple medications already and would like to avoid adding another one if possible.  If headaches persist, may consider starting low dose gabapentin at bedtime  Otherwise, no change in management: 1.  Emgality 2.  Nurtec 3.  Has upcoming appointment with ophthalmology 4.  Follow up in May as already scheduled.  Subjective:  Troi Bechtold is a51year old Caucasian woman with PCOS who follows up for idiopathic intracranial hypertension and neck pain.  UPDATE: To assess paresthesias:  B12 from November was 256.  Advised to start B12 1066mcg daily.  No improvement in paresthesias since starting B12.  Unable to get NCV-EMG because her boss wouldn't give her time off to get it.   Referred to Dr. Manuella Ghazi at [facility].  She has an appointment on March 29.  She has not yet scheduled a sleep study.  She had contracted COVID in early January. She started having dizziness and balance problems.  She had confusion and trouble remembering things such as her phone number.  She started having new headaches, an intermittent sharp shooting pain in the right parietal region and behind right ear with persistent head pressure and pressure in right ear. Ear would pop.  Has seen her ENT about this.  Endorses generalized pain.  She has  had labile hypertension and tachycardia.  She reported blood pressures as high as 188/101 and HR 105-110.  Metoprolol was increased by her PCP yesterday.  She went to the ED on 07/15/2020 where CT of head was performed, which was personally reviewed and was negative.  She was treated with migraine cocktail.    Typical migraines have continued to be controlled.  Current NSAIDS:ibuprofen Current analgesics:none Current triptans:none Current ergotamine:none Current anti-emetic:none Current muscle relaxants:Flexeril 10mg  Current anti-anxiolytic:Xanax Current sleep aide:none Current Antihypertensive medications:Metoprolol succinate  (for tachycardia) Current Antidepressant medications:Prozac 20mg  twice daily Current Anticonvulsant medications:none Current anti-CGRP:Emgality; Nurtec (rescue) Current Vitamins/Herbal/Supplements:D3 Current Antihistamines/Decongestants:none Other therapy:none Hormone/birth control:none  Caffeine:No coffee. Occasional Coke Alcohol:rarely Smoker:2-3 cigarettes daily Diet:16 oz water daily. Skips meals.  Exercise:yes Depression:improving; Anxiety:Improving. Sees a therapist. Other pain:Neck pain. Sometimes associated with pain and numbness into the fingers involving either arm) Sleep hygiene:Some trouble falling asleep  HISTORY: Onset:1996 Location:Left sided from front to back and down the side of neck. Quality:Sometimes throbbing, sometimes shooting Initial intensity:8/10.Shedenies new headache, thunderclap headache Aura:no Premonitory Phase:lethargy Postdrome:lethargy Associated symptoms:Nausea, sometimes vomiting, photophobia, phonophobia, osmophobia, blurred vision/clouding of vision, dizziness. Shedenies associated unilateral numbness or weakness. Initial duration:1 to 3 or 4 days Initial frequency:Once a week (5 to 10 days a month on average) Initial frequency of  abortive medication:maybe once a week Triggers:Change in weather, emotional stress Relieving factors:Ice packs Activity:Aggravates  She was diagnosed with idiopathic intracranial hypertension in 2011. She presented with severe headaches. Eye exam revealed papilledema. She did have prior brain MRIover a  decade ago. She was previously on Diamox and Lasix. Last LP was in 2012 while pregnant. No symptoms since then. Denies visual disturbance other than with headache.   For 10 years, she reportsintermittentnumbness and tingling various parts of her body and burning on back of neck, shoulders and sometimes down the arms or down the legs. They often occur a couple of times a week and may last up to 3 hours. She reports that she has decreased sensation worse on her left side when compared to her right sided. She saw a spine specialist. MRI of cervical, thoracic and lumbar spine on 10/20/2019 personally reviewed was unremarkable with minimal spurring at C4-5 and mild lumbar facet hypertrophy but nothing significant that would explain her symptoms.  MRI of brain with and without contrast on 01/22/2020 was normal.  TSH from 03/11/2020 was 2.26   Past NSAIDS:Aleve Past analgesics:Tylenol, Excedrin, possibly Fioricet Past abortive triptans:Sumatriptan Waldron (chest tightness/difficulty breathing), Maxalt (ineffective) Past abortive ergotamine:no Past muscle relaxants:tizanidine Past anti-emetic:promethazine Past antihypertensive medications:no Past antidepressant medications:Sertraline, maybe amitriptyline, Celexa, Effexor Past anticonvulsant medications:topiramate (kidney issues) Past anti-CGRP:Aimovig (ineffective), [name] $RemoveBefor'100mg'nEGTvlHlqoiA$  Past vitamins/Herbal/Supplements:no Past antihistamines/decongestants:no Other past therapies:no   Family history of headache:Second cousin (IIH, "brain tumor"); great grandfather both had IIH  PAST MEDICAL HISTORY: Past  Medical History:  Diagnosis Date  . Chronic GERD   . Eczema   . Hypertension   . IBS (irritable bowel syndrome)   . Major depressive disorder   . Migraine   . Neck pain   . PTSD (post-traumatic stress disorder)   . Tachycardia, unspecified     MEDICATIONS: Current Outpatient Medications on File Prior to Visit  Medication Sig Dispense Refill  . ALPRAZolam (XANAX) 0.5 MG tablet Take 0.5 mg by mouth 3 (three) times daily as needed for anxiety.    . Cholecalciferol (VITAMIN D3 PO) Take 2,000 Int'l Units by mouth daily.    . Cyanocobalamin 1000 MCG/ML KIT Please inject 1059mcg daily x 7 days, then 1040mcg x one month then monthly after that (Patient taking differently: Inject 1,000 mcg into the skin every 30 (thirty) days.) 1 kit 11  . cyclobenzaprine (FLEXERIL) 10 MG tablet Take 1 tablet (10 mg total) by mouth at bedtime as needed for muscle spasms. 30 tablet 3  . FLUoxetine (PROZAC) 20 MG tablet Take 60 mg by mouth daily.    . Galcanezumab-gnlm (EMGALITY) 120 MG/ML SOAJ Inject 120 mg into the skin every 28 (twenty-eight) days. 1 pen 11  . ibuprofen (ADVIL) 200 MG tablet Take 800 mg by mouth every 6 (six) hours as needed for mild pain.    . Metoprolol Succinate 25 MG CS24 Take 12.5 mg by mouth in the morning and at bedtime. Half tab in am, half tab in pm    . pantoprazole (PROTONIX) 40 MG tablet Take 40 mg by mouth daily.    . Rimegepant Sulfate (NURTEC) 75 MG TBDP Take 75 mg by mouth daily as needed. (Patient taking differently: Take 75 mg by mouth daily as needed (migraine).) 16 tablet 5  . tiZANidine (ZANAFLEX) 2 MG tablet TAKE 1 TABLET(2 MG) BY MOUTH AT BEDTIME AS NEEDED FOR MUSCLE SPASMS (Patient taking differently: Take 2 mg by mouth at bedtime as needed for muscle spasms.) 30 tablet 3  . traZODone (DESYREL) 50 MG tablet Take 12.5 mg by mouth at bedtime as needed for sleep.     No current facility-administered medications on file prior to visit.    ALLERGIES: Allergies  Allergen  Reactions  .  Other Nausea And Vomiting    MRI contrast  . Topamax [Topiramate] Other (See Comments)    Kidney stones  . Vicodin [Hydrocodone-Acetaminophen] Itching    FAMILY HISTORY: Family History  Problem Relation Age of Onset  . Heart attack Father   . Diabetes Maternal Grandmother   . Diabetes Maternal Grandfather     SOCIAL HISTORY: Social History   Socioeconomic History  . Marital status: Married    Spouse name: Not on file  . Number of children: 2  . Years of education: Not on file  . Highest education level: Not on file  Occupational History  . Occupation: Occupational psychologist  Tobacco Use  . Smoking status: Current Every Day Smoker    Packs/day: 0.50    Start date: 63  . Smokeless tobacco: Never Used  Vaping Use  . Vaping Use: Never used  Substance and Sexual Activity  . Alcohol use: Yes    Comment: Socially  . Drug use: Never  . Sexual activity: Yes    Partners: Male    Birth control/protection: None  Other Topics Concern  . Not on file  Social History Narrative   Right handed   Lives in two story home with husband and 2 children   Social Determinants of Health   Financial Resource Strain: Not on file  Food Insecurity: Not on file  Transportation Needs: Not on file  Physical Activity: Not on file  Stress: Not on file  Social Connections: Not on file  Intimate Partner Violence: Not on file     Objective:  Blood pressure 133/82, pulse 94, height $RemoveBe'5\' 6"'DakbEIchu$  (1.676 m), weight 258 lb 6.4 oz (117.2 kg), SpO2 98 %. General: No acute distress.  Patient appears well-groomed.   Head:  Normocephalic/atraumatic Eyes:  Fundi examined but not visualized Neck: supple, no paraspinal tenderness, full range of motion Heart:  Regular rate and rhythm Lungs:  Clear to auscultation bilaterally Back: No paraspinal tenderness Neurological Exam: alert and oriented to person, place, and time. Attention span and concentration intact, recent and remote memory intact, fund of  knowledge intact.  Speech fluent and not dysarthric, language intact.  Decreased left V1-V3.  Otherwise, CN II-XII intact. Bulk and tone normal, muscle strength 5/5 throughout.  Sensation to pinprick reduced on left arm, vibration intact.  Deep tendon reflexes 2+ throughout, toes downgoing.  Finger to nose and heel to shin testing intact.  Gait normal, Romberg with sway.   Metta Clines, DO  CC:  Alma Friendly, MD

## 2020-07-24 ENCOUNTER — Ambulatory Visit (INDEPENDENT_AMBULATORY_CARE_PROVIDER_SITE_OTHER): Payer: 59 | Admitting: Neurology

## 2020-07-24 ENCOUNTER — Encounter: Payer: Self-pay | Admitting: Neurology

## 2020-07-24 ENCOUNTER — Other Ambulatory Visit: Payer: Self-pay

## 2020-07-24 VITALS — BP 133/82 | HR 94 | Ht 66.0 in | Wt 258.4 lb

## 2020-07-24 DIAGNOSIS — G4719 Other hypersomnia: Secondary | ICD-10-CM

## 2020-07-24 DIAGNOSIS — R200 Anesthesia of skin: Secondary | ICD-10-CM

## 2020-07-24 DIAGNOSIS — R42 Dizziness and giddiness: Secondary | ICD-10-CM

## 2020-07-24 DIAGNOSIS — R202 Paresthesia of skin: Secondary | ICD-10-CM

## 2020-07-24 DIAGNOSIS — G43109 Migraine with aura, not intractable, without status migrainosus: Secondary | ICD-10-CM | POA: Diagnosis not present

## 2020-07-24 NOTE — Patient Instructions (Signed)
No change in management at this point.  Follow up in May as scheduled.

## 2020-10-20 ENCOUNTER — Other Ambulatory Visit: Payer: Self-pay | Admitting: Neurology

## 2020-10-21 NOTE — Progress Notes (Deleted)
NEUROLOGY FOLLOW UP OFFICE NOTE  Valene Villa [id number]  Assessment/Plan:   1.  New headaches, dizziness, unsteady gait - s/p COVID-19 - not clear if related to COVID.  May be related to anxiety vs eustachian tube dysfunction 2.  Migraine with aura, without status migrainosus, not intractable 3.  Paresthesias - so far workup unremarkable.  Would really need NCV-EMG to complete workup. 4.  Excessive daytime sleepiness - unable to schedule sleep study. 5.  History of idiopathic intracranial hypertension  ***  Subjective:  Amiley Shishido is a48year old Caucasian womanwith PCOSwhofollows up for idiopathic intracranial hypertension and neck pain.  UPDATE: Followed up with Dr. Manuella Ghazi at [facility] on 10/02/2020.  Exam revealed mildly indistinct margins but no active papilledema.  Typical migraines have continued to be controlled.  Current NSAIDS:ibuprofen Current analgesics:none Current triptans:none Current ergotamine:none Current anti-emetic:none Current muscle relaxants:Flexeril 72m Current anti-anxiolytic:Xanax Current sleep aide:none Current Antihypertensive medications:Metoprolol succinate  (for tachycardia) Current Antidepressant medications:Prozac 258mtwice daily Current Anticonvulsant medications:none Current anti-CGRP:Emgality; Nurtec (rescue) Current Vitamins/Herbal/Supplements:D3 Current Antihistamines/Decongestants:none Other therapy:none Hormone/birth control:none  Caffeine:No coffee. Occasional Coke Alcohol:rarely Smoker:2-3 cigarettes daily Diet:16 oz water daily. Skips meals.  Exercise:yes Depression:improving; Anxiety:Improving. Sees a therapist. Other pain:Neck pain. Sometimes associated with pain and numbness into the fingers involving either arm) Sleep hygiene:Some trouble falling asleep  HISTORY: Onset:1996 Location:Left sided from front to back and down the  side of neck. Quality:Sometimes throbbing, sometimes shooting Initial intensity:8/10.Shedenies new headache, thunderclap headache Aura:no Premonitory Phase:lethargy Postdrome:lethargy Associated symptoms:Nausea, sometimes vomiting, photophobia, phonophobia, osmophobia, blurred vision/clouding of vision, dizziness. Shedenies associated unilateral numbness or weakness. Initial duration:1 to 3 or 4 days Initial frequency:Once a week (5 to 10 days a month on average) Initial frequency of abortive medication:maybe once a week Triggers:Change in weather, emotional stress Relieving factors:Ice packs Activity:Aggravates  She had contracted COVID in early January 2022. She started having dizziness and balance problems.  She had confusion and trouble remembering things such as her phone number.  She started having new headaches, an intermittent sharp shooting pain in the right parietal region and behind right ear with persistent head pressure and pressure in right ear. Ear would pop.  Has seen her ENT about this.  Endorses generalized pain.  She has had labile hypertension and tachycardia.  She reported blood pressures as high as 188/101 and HR 105-110.  She went to the ED on 07/15/2020 where CT of head was performed, which was personally reviewed and was negative.  She was treated with migraine cocktail.    She was diagnosed with idiopathic intracranial hypertension in 2011. She presented with severe headaches. Eye exam revealed papilledema. She did have prior brain MRIover a decade ago. She was previously on Diamox and Lasix. Last LP was in 2012 while pregnant. No symptoms since then. Denies visual disturbance other than with headache.   For 10 years, she reportsintermittentnumbness and tingling various parts of her body and burning on back of neck, shoulders and sometimes down the arms or down the legs. They often occur a couple of times a week and may last up to 3  hours. She reports that she has decreased sensation worse on her left side when compared to her right sided. She saw a spine specialist. MRI of cervical, thoracic and lumbar spine on 10/20/2019 personally reviewed was unremarkable with minimal spurring at C4-5 and mild lumbar facet hypertrophy but nothing significant that would explain her symptoms.  MRI of brain with and without contrast on 01/22/2020 was  normal.  TSH from 03/11/2020 was 2.26.  B12 from November 2021 was 256.  Advised to start B12 1058mg daily.  No improvement in paresthesias since starting B12.  Unable to get NCV-EMG because her boss wouldn't give her time off to get it.    Past NSAIDS:Aleve Past analgesics:Tylenol, Excedrin, possibly Fioricet Past abortive triptans:Sumatriptan [place] (chest tightness/difficulty breathing), Maxalt (ineffective) Past abortive ergotamine:no Past muscle relaxants:tizanidine Past anti-emetic:promethazine Past antihypertensive medications:no Past antidepressant medications:Sertraline, maybe amitriptyline, Celexa, Effexor Past anticonvulsant medications:topiramate (kidney issues) Past anti-CGRP:Aimovig (ineffective), Ubrelvy 10106mPast vitamins/Herbal/Supplements:no Past antihistamines/decongestants:no Other past therapies:no   Family history of headache:Second cousin (IIH, "brain tumor"); great grandfather both had IIH  PAST MEDICAL HISTORY: Past Medical History:  Diagnosis Date  . Chronic GERD   . Eczema   . Hypertension   . IBS (irritable bowel syndrome)   . Major depressive disorder   . Migraine   . Neck pain   . PTSD (post-traumatic stress disorder)   . Tachycardia, unspecified     MEDICATIONS: Current Outpatient Medications on File Prior to Visit  Medication Sig Dispense Refill  . ALPRAZolam (XANAX) 0.5 MG tablet Take 0.5 mg by mouth 3 (three) times daily as needed for anxiety.    . Cholecalciferol (VITAMIN D3 PO) Take 2,000 Int'l Units by mouth  daily.    . Cyanocobalamin 1000 MCG/ML KIT Please inject 100037mdaily x 7 days, then 1000m11m one month then monthly after that (Patient taking differently: Inject 1,000 mcg into the skin every 30 (thirty) days.) 1 kit 11  . cyclobenzaprine (FLEXERIL) 10 MG tablet Take 1 tablet (10 mg total) by mouth at bedtime as needed for muscle spasms. 30 tablet 3  . FLUoxetine (PROZAC) 20 MG tablet Take 60 mg by mouth daily.    . Galcanezumab-gnlm (EMGALITY) 120 MG/ML SOAJ Inject 120 mg into the skin every 28 (twenty-eight) days. 1 pen 11  . ibuprofen (ADVIL) 200 MG tablet Take 800 mg by mouth every 6 (six) hours as needed for mild pain.    . Metoprolol Succinate 25 MG CS24 Take 12.5 mg by mouth in the morning and at bedtime. Half tab in am, half tab in pm    . pantoprazole (PROTONIX) 40 MG tablet Take 40 mg by mouth daily.    . Rimegepant Sulfate (NURTEC) 75 MG TBDP Take 75 mg by mouth daily as needed. (Patient taking differently: Take 75 mg by mouth daily as needed (migraine).) 16 tablet 5  . tiZANidine (ZANAFLEX) 2 MG tablet TAKE 1 TABLET(2 MG) BY MOUTH AT BEDTIME AS NEEDED FOR MUSCLE SPASMS (Patient taking differently: Take 2 mg by mouth at bedtime as needed for muscle spasms.) 30 tablet 3  . traZODone (DESYREL) 50 MG tablet Take 12.5 mg by mouth at bedtime as needed for sleep.     No current facility-administered medications on file prior to visit.    ALLERGIES: Allergies  Allergen Reactions  . Other Nausea And Vomiting    MRI contrast  . Topamax [Topiramate] Other (See Comments)    Kidney stones  . Vicodin [Hydrocodone-Acetaminophen] Itching    FAMILY HISTORY: Family History  Problem Relation Age of Onset  . Heart attack Father   . Diabetes Maternal Grandmother   . Diabetes Maternal Grandfather       Objective:  *** General: No acute distress.  Patient appears ***-groomed.   Head:  Normocephalic/atraumatic Eyes:  Fundi examined but not visualized Neck: supple, no paraspinal  tenderness, full range of motion Heart:  Regular rate and  rhythm Lungs:  Clear to auscultation bilaterally Back: No paraspinal tenderness Neurological Exam: alert and oriented to person, place, and time. Speech fluent and not dysarthric, language intact.  CN II-XII intact. Bulk and tone normal, muscle strength 5/5 throughout.  Sensation to light touch  intact.  Deep tendon reflexes 2+ throughout.  Finger to nose testing intact.  Gait normal, Romberg negative.  Metta Clines, DO  CC: Alma Friendly, MD

## 2020-10-22 ENCOUNTER — Ambulatory Visit: Payer: 59 | Admitting: Neurology

## 2020-10-29 DIAGNOSIS — Z8669 Personal history of other diseases of the nervous system and sense organs: Secondary | ICD-10-CM | POA: Insufficient documentation

## 2020-11-18 ENCOUNTER — Other Ambulatory Visit: Payer: Self-pay | Admitting: Neurology

## 2020-12-19 ENCOUNTER — Telehealth: Payer: Self-pay | Admitting: Neurology

## 2020-12-19 NOTE — Telephone Encounter (Signed)
Patient called and left a voice mail stating that both her Emgality and her Nurtec need prior authorization.

## 2020-12-20 ENCOUNTER — Other Ambulatory Visit: Payer: Self-pay | Admitting: Neurology

## 2020-12-20 MED ORDER — AJOVY 225 MG/1.5ML ~~LOC~~ SOAJ: 225.0000 mg | SUBCUTANEOUS | 5 refills | Status: DC

## 2020-12-20 NOTE — Progress Notes (Signed)
Emgality denied.  Ajovy covered

## 2020-12-20 NOTE — Telephone Encounter (Signed)
Received emgallity request, calling pt for denial request for nurtec to be sent. Will work on [employer] today. Message left on voicemail.

## 2021-01-10 ENCOUNTER — Telehealth: Payer: Self-pay | Admitting: Neurology

## 2021-01-10 NOTE — Telephone Encounter (Signed)
Patient called for an update on her Emgality prior authorization.  She is out of the medication and is beginning to get a migraine, she said.

## 2021-01-14 NOTE — Telephone Encounter (Signed)
Patient needs to check on her insurance, need new updated card?

## 2021-01-15 NOTE — Telephone Encounter (Signed)
Called and left a message for patient that we need new insurance information/card.

## 2021-01-16 NOTE — Telephone Encounter (Signed)
LMOVM if pt like she can come by and get a sample.

## 2021-01-31 ENCOUNTER — Telehealth: Payer: Self-pay | Admitting: Neurology

## 2021-01-31 NOTE — Telephone Encounter (Signed)
Patient states that she has left VM and message with the front office yesterday about the Emgality medication. She is trying to find out what he would like to do about the emagality     Please call

## 2021-01-31 NOTE — Telephone Encounter (Signed)
Telephone call to pt, Advised we have called to ask for Insurance information in August.   Will advised [employer] card in scanned in chart.   Pt to stop by and pick up samples of Emgality.

## 2021-02-05 ENCOUNTER — Telehealth: Payer: Self-pay

## 2021-02-05 NOTE — Telephone Encounter (Signed)
New message   Status Sent to Plan today  Alliah Boulanger Key: [id number] - PA Case ID: [id number] Need help? Call us at [phone]  Drug Emgality 120MG /ML auto-injectors (migraine)  Form [employer] Electronic Prior Authorization Form (2017 [place]CP[place])

## 2021-02-05 NOTE — Telephone Encounter (Signed)
F/u   Status Sent to Plan today  Drug Emgality 120MG /ML auto-injectors (migraine)  Form [employer] Electronic Prior Authorization Form (2017 NCPDP)

## 2021-02-05 NOTE — Telephone Encounter (Signed)
F/u  Outcome Approved today  Request Reference Number: [id number]. EMGALITY INJ 120MG /ML is approved through 08/05/2021. Your patient may now fill this prescription and it will be covered. Drug Emgality 120MG /ML auto-injectors (migraine) Form [employer] Electronic Prior Authorization Form (2017 NCPDP)

## 2021-02-05 NOTE — Telephone Encounter (Signed)
F/u  Outcome Approved today  Request Reference Number: [id number]. EMGALITY INJ 120MG/ML is approved through 08/05/2021. Your patient may now fill this prescription and it will be covered. Drug Emgality 120MG/ML auto-injectors (migraine) Form [employer] Electronic Prior Authorization Form (2017 NCPDP) 

## 2021-02-14 ENCOUNTER — Telehealth: Payer: Self-pay

## 2021-02-14 NOTE — Telephone Encounter (Signed)
New message   [name] (Key: [id number]) Nurtec 75MG  dispersible tablets   Form [employer] Electronic Prior Authorization Form (2017 NCPDP) Created 32 minutes ago Sent to Plan 31 minutes ago Plan Response 30 minutes ago Submit Clinical Questions 13 minutes ago Determination Unfavorable 2 minutes ago Message from Plan Request Reference Number: [id number]. NURTEC TAB 75MG  ODT is denied for not meeting the prior authorization requirement(s). Details of this decision are in the notice attached below or have been faxed to you. Appeals are not supported through ePA. Please refer to the fax case notice for appeals information and instructions.

## 2021-03-20 NOTE — Progress Notes (Signed)
Office Visit Note  Patient: [name]             Date of Birth: 05/19/1981           MRN: [id number]             PCP: Vernona Rieger, MD Referring: Cecil Cobbs Visit Date: 03/28/2021 Occupation: @GUAROCC @  Subjective:  Pain in multiple joints.   History of Present Illness: [name] is a 40 y.o. female seen in consultation per request of her PCP.  According the patient her symptoms a started with a rash on her lower back in 2006.  In 2020 the rash is spread to the other parts of her body and she was evaluated by Dr. 2021 in [place].  She had a skin biopsy and she was diagnosed with psoriasis.  She had frequent dosing of prednisone which cleared her psoriasis she also recalls taking Solu-Medrol injections for psoriasis.  She was given topical agents initially and then was given a prescription for a Skyrizi.  She states she developed severe bronchitis and had to come off the medication.  She is concerned about immunosuppression.  She gets prednisone frequently.  She still has psoriasis on her arms, legs and her lower back.  She has psoriasis in her scalp and her ear canals.  She states she gets prednisone almost every other month from her work for psoriasis flares.  Her last prednisone taper was a week ago.  She gives history of joint pain for the last 11 years.  She describes pain initially started in her trochanteric area and SI joints and gradually moved to the other joints.  She gives history of discomfort in her cervical spine, lumbar spine, SI joints, shoulders, elbows, hands, trochanteric area, hips, knees, ankles and her feet.  She has noticed swelling in her hands and her feet.  She denies any history of dactylitis.  She has had plantar fasciitis in the past for which she has seen podiatrist.  She denies any history of Achilles tendinitis.  The last episode of Planter fasciitis was 5 years ago.  No history of iritis or uveitis.  She has chronic diarrhea  and abdominal bloating.  She was diagnosed with IBS.  There is no known family history of psoriasis.  Gravida 3 para 3 (1 stillborn).  No history of DVTs.    Activities of Daily Living:  Patient reports morning stiffness for 30-45 minutes.   Patient Reports nocturnal pain.  Difficulty dressing/grooming: Reports Difficulty climbing stairs: Reports Difficulty getting out of chair: Reports Difficulty using hands for taps, buttons, cutlery, and/or writing: Reports  Review of Systems  Constitutional:  Positive for fatigue.  HENT:  Positive for nose dryness. Negative for mouth sores and mouth dryness.   Eyes:  Positive for itching and dryness. Negative for pain.  Respiratory:  Positive for shortness of breath. Negative for difficulty breathing.   Cardiovascular:  Positive for palpitations. Negative for chest pain.  Gastrointestinal:  Positive for constipation and diarrhea. Negative for blood in stool.  Endocrine: Negative for increased urination.  Genitourinary:  Negative for difficulty urinating.  Musculoskeletal:  Positive for joint pain, joint pain, joint swelling, myalgias, morning stiffness, muscle tenderness and myalgias.  Skin:  Positive for rash. Negative for color change.  Allergic/Immunologic: Positive for susceptible to infections.  Neurological:  Positive for dizziness, numbness and headaches. Negative for memory loss.  Hematological:  Positive for bruising/bleeding tendency.  Psychiatric/Behavioral:  Negative for confusion.    PMFS  History:  Patient Active Problem List   Diagnosis Date Noted   Tachycardia, unspecified    Hypertension    Chronic GERD    Major depressive disorder    Migraine    PP care - s/p 1C/S 1/19 06/20/2011    Past Medical History:  Diagnosis Date   Anxiety    celexa daily   Arthritis    hands/wrist - no meds   Chronic GERD    Depression    celexa daily   Dysrhythmia    hx irregular heart beats - no current prob, stress related.   Eczema     GERD (gastroesophageal reflux disease)    tums prn   Headache(784.0)    OTC prn   History of seasonal allergies    benadryl prn   Hypertension    IBS (irritable bowel syndrome)    IIH (idiopathic intracranial hypertension)    on lasix prior to pregnancy   Major depressive disorder    Migraine    Neck pain    PONV (postoperative nausea and vomiting)    PP care - s/p 1C/S 1/19 06/20/2011   Pseudotumor cerebri    PTSD (post-traumatic stress disorder)    Sinusitis    Strep throat    Tachycardia, unspecified     Family History  Problem Relation Age of Onset   Diabetes Mother    Heart disease Mother    Asthma Mother    Cancer Mother    Heart attack Father    Diabetes Father    Hyperlipidemia Sister    Diabetes Maternal Grandmother    Diabetes Maternal Grandfather    Migraines Son    Healthy Son    Healthy Daughter    Past Surgical History:  Procedure Laterality Date   CESAREAN SECTION  06/20/2011   Procedure: CESAREAN SECTION;  Surgeon: Lenoard Aden, MD;  Location: [facility];  Service: Gynecology;  Laterality: N/A;  Primary  EDD: 07/15/11/Hx of shoulder dystocia.  Primary cesarean section with delivery of baby boy at 67. Apgars 9/9.   CESAREAN SECTION     2013-2014   CHOLECYSTECTOMY  01/2003   CHOLECYSTECTOMY     HYSTEROTOMY  07/2013   NO PAST SURGERIES     SVD  07/27/1999   fetal demise - shoulder dystocia   WISDOM TOOTH EXTRACTION     Social History   Social History Narrative   ** Merged History Encounter **       Right handed Lives in two story home with husband and 2 children   Immunization History  Administered Date(s) Administered   Influenza,inj,Quad PF,6+ Mos 02/22/2019   PFIZER(Purple Top)SARS-COV-2 Vaccination 01/05/2020, 01/26/2020     Objective: Vital Signs: BP 122/85 (BP Location: Right Arm, Patient Position: Sitting, Cuff Size: Normal)   Pulse 82   Ht 5\' 6"  (1.676 m)   Wt 261 lb (118.4 kg)   LMP 11/26/2010   BMI 42.13 kg/m    Physical  Exam Vitals and nursing note reviewed.  Constitutional:      Appearance: She is well-developed.  HENT:     Head: Normocephalic and atraumatic.  Eyes:     Conjunctiva/sclera: Conjunctivae normal.  Cardiovascular:     Rate and Rhythm: Normal rate and regular rhythm.     Heart sounds: Normal heart sounds.  Pulmonary:     Effort: Pulmonary effort is normal.     Breath sounds: Normal breath sounds.  Abdominal:     General: Bowel sounds are normal.  Palpations: Abdomen is soft.  Musculoskeletal:     Cervical back: Normal range of motion.  Lymphadenopathy:     Cervical: No cervical adenopathy.  Skin:    General: Skin is warm and dry.     Capillary Refill: Capillary refill takes less than 2 seconds.     Comments: Psoriasis patches were noted on bilateral upper extremities.  Neurological:     Mental Status: She is alert and oriented to person, place, and time.  Psychiatric:        Behavior: Behavior normal.     Musculoskeletal Exam: C-spine thoracic and lumbar spine were in good range of motion with discomfort.  She tenderness over SI joints.  Shoulder joints, elbow joints, wrist joints, MCPs PIPs and DIPs with good range of motion with no synovitis.  She had tenderness on palpation over right third and fourth PIP joints and left fourth PIP joint with no synovitis.  Hip joints, knee joints, ankles, MTPs and PIPs with good range of motion with no synovitis.  She had tenderness across her MTPs but no synovitis was noted.  There was no evidence of Achilles tendinitis or plantar fasciitis.  She had generalized hyperalgesia and positive tender points.  CDAI Exam: CDAI Score: -- Patient Global: --; Provider Global: -- Swollen: --; Tender: -- Joint Exam 03/28/2021   No joint exam has been documented for this visit   There is currently no information documented on the homunculus. Go to the Rheumatology activity and complete the homunculus joint exam.  Investigation: No additional  findings.  Imaging: No results found.  Recent Labs: Lab Results  Component Value Date   WBC 9.3 07/15/2020   HGB 12.0 07/15/2020   PLT 321 07/15/2020   NA 140 07/15/2020   K 3.5 07/15/2020   CL 104 07/15/2020   CO2 28 07/15/2020   GLUCOSE 112 (H) 07/15/2020   BUN 10 07/15/2020   CREATININE 1.00 07/15/2020   BILITOT 0.6 07/15/2020   ALKPHOS 72 07/15/2020   AST 16 07/15/2020   ALT 29 07/15/2020   PROT 6.7 07/15/2020   ALBUMIN 3.7 07/15/2020   CALCIUM 8.7 (L) 07/15/2020   GFRAA >90 06/19/2011    Speciality Comments: No specialty comments available.  Procedures:  No procedures performed Allergies: Azithromycin, Other, Topamax [topiramate], and Vicodin [hydrocodone-acetaminophen]   Assessment / Plan:     Visit Diagnoses: Arthritis-she has history of pain and discomfort in almost all of her joints for the last 11 years.  She had extensive work-up by Dr. Yevette Edwards in the past including MRI of her cervical thoracic and lumbar spine which showed some degenerative changes.  She gives history of pain and discomfort in all of her joints and intermittent swelling in her hands and her feet.  There is no history of typical dactylitis.  She also had recurrent plantar fasciitis in the past for which she has seen a podiatrist.  She states she has not had plantar fasciitis in the last 5 years.  Psoriasis-she has had recurrent psoriasis since 2006.  She has been under care of Dr. Londell Moh in [place].  She was given a Careers adviser but it was discontinued after she developed severe bronchitis.  She is hesitant to go on Otezla due to her GI symptoms.  She has not tried any DMARDs and does not want to try methotrexate.  She has been getting prednisone almost every other month or Solu-Medrol injections for psoriasis flares.  Her last prednisone taper was a week ago.  She still have some  psoriasis on her arms.  I detailed discussion regarding the side effects of prednisone use including the risk of  osteoporosis, weight gain, metabolic syndrome, hypertension, heart disease and diabetes.  She may benefit from the use of Taltz.  She can discuss that with her dermatologist.  High risk medication use -in anticipation to start her on future immunosuppressive therapy I will obtain following labs.  Plan: CBC with Differential/Platelet, COMPLETE METABOLIC PANEL WITH GFR, Hepatitis B core antibody, IgM, Hepatitis B surface antigen, Hepatitis C antibody, QuantiFERON-TB Gold Plus, Serum protein electrophoresis with reflex, IgG, IgA, IgM, Glucose 6 phosphate dehydrogenase  Myalgia -she has generalized hyperalgesia, positive tender points.  I discussed briefly the possibility of fibromyalgia syndrome.  She was in agreement.  Plan: CK  Pain in both hands -she complains of pain and discomfort in her bilateral hands.  She had tenderness over some of her PIPs but no synovitis was noted.  Plan: XR Hand 2 View Right, XR Hand 2 View Left, Sedimentation rate, ANA, Cyclic citrul peptide antibody, IgG  Bilateral trochanteric bursitis-she had tenderness over bilateral trochanteric bursa.  Pain in both feet -she complains of pain and discomfort in her bilateral feet.  There was no evidence of Achilles tendinitis or plantar fasciitis.  There was no evidence of dactylitis.  No synovitis was noted.  Plan: XR Foot 2 Views Right, XR Foot 2 Views Left.  X-rays of bilateral feet showed osteoarthritic changes.  Chronic SI joint pain -she had tenderness over SI joints.  She also had tenderness over gluteal region.  Plan: XR Pelvis 1-2 Views.  Mild SI joint sclerosis was noted consistent with osteoarthritis.  No SI joint narrowing or erosive changes were noted.  DDD cervical-I reviewed MRI from the past which showed degenerative changes.  DDD thoracic-degenerative changes were noted on the thoracic spine MRI from the past.  Lumbar facet joint arthropathy-MRI findings were reviewed which were consistent with facet joint  arthropathy.  Other medical problems are listed as follows:  Primary hypertension  Tachycardia  Chronic GERD  History of IBS  Hx of migraines - She is on Emgality.  Followed by Dr. Everlena Cooper  Pseudotumor cerebri - Diagnosed in 2011.  According the patient she is in remission.  History of depression  Former smoker - 1/4 PPD X 15 years. She quit smoking 06/2019.  PCOS (polycystic ovarian syndrome)    Orders: Orders Placed This Encounter  Procedures   XR Hand 2 View Right   XR Hand 2 View Left   XR Foot 2 Views Right   XR Foot 2 Views Left   XR Pelvis 1-2 Views   CBC with Differential/Platelet   COMPLETE METABOLIC PANEL WITH GFR   Sedimentation rate   CK   ANA   Cyclic citrul peptide antibody, IgG   Hepatitis B core antibody, IgM   Hepatitis B surface antigen   Hepatitis C antibody   QuantiFERON-TB Gold Plus   Serum protein electrophoresis with reflex   IgG, IgA, IgM   Glucose 6 phosphate dehydrogenase    No orders of the defined types were placed in this encounter.   Follow-Up Instructions: Return for PSA, PS.   Pollyann Savoy, MD  Note - This record has been created using Animal nutritionist.  Chart creation errors have been sought, but may not always  have been located. Such creation errors do not reflect on  the standard of medical care.

## 2021-03-28 ENCOUNTER — Other Ambulatory Visit: Payer: Self-pay

## 2021-03-28 ENCOUNTER — Ambulatory Visit: Payer: Self-pay

## 2021-03-28 ENCOUNTER — Ambulatory Visit: Payer: 59 | Admitting: Rheumatology

## 2021-03-28 ENCOUNTER — Encounter: Payer: Self-pay | Admitting: Rheumatology

## 2021-03-28 VITALS — BP 122/85 | HR 82 | Ht 66.0 in | Wt 261.0 lb

## 2021-03-28 DIAGNOSIS — G932 Benign intracranial hypertension: Secondary | ICD-10-CM

## 2021-03-28 DIAGNOSIS — Z8659 Personal history of other mental and behavioral disorders: Secondary | ICD-10-CM

## 2021-03-28 DIAGNOSIS — M1990 Unspecified osteoarthritis, unspecified site: Secondary | ICD-10-CM

## 2021-03-28 DIAGNOSIS — K219 Gastro-esophageal reflux disease without esophagitis: Secondary | ICD-10-CM

## 2021-03-28 DIAGNOSIS — M533 Sacrococcygeal disorders, not elsewhere classified: Secondary | ICD-10-CM

## 2021-03-28 DIAGNOSIS — R000 Tachycardia, unspecified: Secondary | ICD-10-CM

## 2021-03-28 DIAGNOSIS — M47816 Spondylosis without myelopathy or radiculopathy, lumbar region: Secondary | ICD-10-CM

## 2021-03-28 DIAGNOSIS — M79671 Pain in right foot: Secondary | ICD-10-CM

## 2021-03-28 DIAGNOSIS — M7062 Trochanteric bursitis, left hip: Secondary | ICD-10-CM

## 2021-03-28 DIAGNOSIS — Z8719 Personal history of other diseases of the digestive system: Secondary | ICD-10-CM

## 2021-03-28 DIAGNOSIS — M79672 Pain in left foot: Secondary | ICD-10-CM

## 2021-03-28 DIAGNOSIS — E282 Polycystic ovarian syndrome: Secondary | ICD-10-CM

## 2021-03-28 DIAGNOSIS — Z79899 Other long term (current) drug therapy: Secondary | ICD-10-CM

## 2021-03-28 DIAGNOSIS — M7061 Trochanteric bursitis, right hip: Secondary | ICD-10-CM

## 2021-03-28 DIAGNOSIS — Z87891 Personal history of nicotine dependence: Secondary | ICD-10-CM

## 2021-03-28 DIAGNOSIS — I10 Essential (primary) hypertension: Secondary | ICD-10-CM

## 2021-03-28 DIAGNOSIS — Z8669 Personal history of other diseases of the nervous system and sense organs: Secondary | ICD-10-CM

## 2021-03-28 DIAGNOSIS — M79641 Pain in right hand: Secondary | ICD-10-CM

## 2021-03-28 DIAGNOSIS — M5030 Other cervical disc degeneration, unspecified cervical region: Secondary | ICD-10-CM

## 2021-03-28 DIAGNOSIS — M79642 Pain in left hand: Secondary | ICD-10-CM

## 2021-03-28 DIAGNOSIS — M5134 Other intervertebral disc degeneration, thoracic region: Secondary | ICD-10-CM

## 2021-03-28 DIAGNOSIS — L409 Psoriasis, unspecified: Secondary | ICD-10-CM

## 2021-03-28 DIAGNOSIS — M791 Myalgia, unspecified site: Secondary | ICD-10-CM

## 2021-03-28 DIAGNOSIS — G8929 Other chronic pain: Secondary | ICD-10-CM

## 2021-04-03 LAB — COMPLETE METABOLIC PANEL WITH GFR
AG Ratio: 1.4 (calc) (ref 1.0–2.5)
ALT: 18 U/L (ref 6–29)
AST: 14 U/L (ref 10–30)
Albumin: 3.8 g/dL (ref 3.6–5.1)
Alkaline phosphatase (APISO): 76 U/L (ref 31–125)
BUN: 10 mg/dL (ref 7–25)
CO2: 26 mmol/L (ref 20–32)
Calcium: 8.8 mg/dL (ref 8.6–10.2)
Chloride: 103 mmol/L (ref 98–110)
Creat: 0.89 mg/dL (ref 0.50–0.99)
Globulin: 2.7 g/dL (calc) (ref 1.9–3.7)
Glucose, Bld: 94 mg/dL (ref 65–99)
Potassium: 3.9 mmol/L (ref 3.5–5.3)
Sodium: 140 mmol/L (ref 135–146)
Total Bilirubin: 0.3 mg/dL (ref 0.2–1.2)
Total Protein: 6.5 g/dL (ref 6.1–8.1)
eGFR: 83 mL/min/{1.73_m2} (ref >=60)

## 2021-04-03 LAB — SEDIMENTATION RATE: Sed Rate: 9 mm/h (ref 0–20)

## 2021-04-03 LAB — QUANTIFERON-TB GOLD PLUS
Mitogen-NIL: >10 IU/mL
NIL: 0.02 IU/mL
QuantiFERON-TB Gold Plus: NEGATIVE
TB1-NIL: 0 IU/mL
TB2-NIL: 0 IU/mL

## 2021-04-03 LAB — CBC WITH DIFFERENTIAL/PLATELET
Absolute Monocytes: 591 cells/uL (ref 200–950)
Basophils Absolute: 41 cells/uL (ref 0–200)
Basophils Relative: 0.5 %
Eosinophils Absolute: 251 cells/uL (ref 15–500)
Eosinophils Relative: 3.1 %
HCT: 39.2 % (ref 35.0–45.0)
Hemoglobin: 13 g/dL (ref 11.7–15.5)
Lymphs Abs: 2414 cells/uL (ref 850–3900)
MCH: 30.1 pg (ref 27.0–33.0)
MCHC: 33.2 g/dL (ref 32.0–36.0)
MCV: 90.7 fL (ref 80.0–100.0)
MPV: 9.2 fL (ref 7.5–12.5)
Monocytes Relative: 7.3 %
Neutro Abs: 4803 cells/uL (ref 1500–7800)
Neutrophils Relative %: 59.3 %
Platelets: 375 10*3/uL (ref 140–400)
RBC: 4.32 10*6/uL (ref 3.80–5.10)
RDW: 12.1 % (ref 11.0–15.0)
Total Lymphocyte: 29.8 %
WBC: 8.1 10*3/uL (ref 3.8–10.8)

## 2021-04-03 LAB — PROTEIN ELECTROPHORESIS, SERUM, WITH REFLEX
Albumin ELP: 3.9 g/dL (ref 3.8–4.8)
Alpha 1: 0.3 g/dL (ref 0.2–0.3)
Alpha 2: 0.7 g/dL (ref 0.5–0.9)
Beta 2: 0.4 g/dL (ref 0.2–0.5)
Beta Globulin: 0.4 g/dL (ref 0.4–0.6)
Gamma Globulin: 1.1 g/dL (ref 0.8–1.7)
Total Protein: 6.8 g/dL (ref 6.1–8.1)

## 2021-04-03 LAB — GLUCOSE 6 PHOSPHATE DEHYDROGENASE: G-6PDH: 13.2 U/g Hgb (ref 7.0–20.5)

## 2021-04-03 LAB — IGG, IGA, IGM
IgG (Immunoglobin G), Serum: 1157 mg/dL (ref 600–1640)
IgM, Serum: 182 mg/dL (ref 50–300)
Immunoglobulin A: 176 mg/dL (ref 47–310)

## 2021-04-03 LAB — HEPATITIS B CORE ANTIBODY, IGM: Hep B C IgM: NONREACTIVE

## 2021-04-03 LAB — ANA: Anti Nuclear Antibody (ANA): NEGATIVE

## 2021-04-03 LAB — HEPATITIS B SURFACE ANTIGEN: Hepatitis B Surface Ag: NONREACTIVE

## 2021-04-03 LAB — CYCLIC CITRUL PEPTIDE ANTIBODY, IGG: Cyclic Citrullin Peptide Ab: <16 UNITS

## 2021-04-03 LAB — HEPATITIS C ANTIBODY
Hepatitis C Ab: NONREACTIVE
SIGNAL TO CUT-OFF: 0.05 (ref <1.00)

## 2021-04-03 LAB — CK: Total CK: 69 U/L (ref 29–143)

## 2021-04-04 NOTE — Progress Notes (Signed)
All the labs are within normal limits.  I will discuss results at the follow-up visit.

## 2021-04-16 NOTE — Progress Notes (Signed)
Office Visit Note  Patient: [name]             Date of Birth: 17-Feb-1981           MRN: [id number]             PCP: Vernona Rieger, MD Referring: Vernona Rieger, MD Visit Date: 04/29/2021 Occupation: @GUAROCC @  Subjective:  Pain in multiple joints and psoriasis.   History of Present Illness: [name] is a 40 y.o. female with history of psoriasis and pain in multiple joints.  She continues to have pain and discomfort in her bilateral hands, SI joints and bilateral feet.  She has pain in her entire spine.  She also complains of discomfort in her bilateral hips and trochanteric area.  She has not had any episodes of plantar fasciitis recently.  She states she was diagnosed with plantar fasciitis about 8 years ago.  She states her psoriasis is flaring and now she has it on her trunk and extremities.  She also has some psoriasis in her scalp.  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  She continues to have generalized pain and discomfort from fibromyalgia.  Activities of Daily Living:  Patient reports morning stiffness for all day. Patient Reports nocturnal pain.  Difficulty dressing/grooming: Denies Difficulty climbing stairs: Reports Difficulty getting out of chair: Reports Difficulty using hands for taps, buttons, cutlery, and/or writing: Reports  Review of Systems  Constitutional:  Positive for fatigue.  HENT:  Positive for mouth dryness and nose dryness. Negative for mouth sores.   Eyes:  Positive for itching. Negative for pain and dryness.  Respiratory:  Negative for shortness of breath and difficulty breathing.   Cardiovascular:  Positive for palpitations. Negative for chest pain.  Gastrointestinal:  Negative for blood in stool, constipation and diarrhea.  Endocrine: Positive for increased urination.  Genitourinary:  Negative for difficulty urinating.  Musculoskeletal:  Positive for joint  pain, joint pain, joint swelling, myalgias, morning stiffness, muscle tenderness and myalgias.  Skin:  Positive for rash. Negative for color change and sensitivity to sunlight.  Allergic/Immunologic: Positive for susceptible to infections.  Neurological:  Positive for weakness. Negative for dizziness and headaches.  Hematological:  Positive for bruising/bleeding tendency. Negative for swollen glands.  Psychiatric/Behavioral:  Positive for depressed mood. Negative for confusion and sleep disturbance. The patient is not nervous/anxious.    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   Eczema    GERD (gastroesophageal reflux disease)  tums prn   Headache(784.0)    OTC prn   History of seasonal allergies    benadryl prn   Hypertension    IBS (irritable bowel syndrome)    IIH (idiopathic intracranial hypertension)    on lasix prior to pregnancy   Major depressive disorder    Migraine    Neck pain    PONV (postoperative nausea and vomiting)    PP care - s/p 1C/S 1/19 06/20/2011   Pseudotumor cerebri    PTSD (post-traumatic stress  disorder)    Sinusitis    Strep throat    Tachycardia, unspecified     Family History  Problem Relation Age of Onset   Diabetes Mother    Heart disease Mother    Asthma Mother    Cancer Mother    Heart attack Father    Diabetes Father    Hyperlipidemia Sister    Diabetes Maternal Grandmother    Diabetes Maternal Grandfather    Migraines Son    Healthy Son    Healthy Daughter    Past Surgical History:  Procedure Laterality Date   CESAREAN SECTION  06/20/2011   Procedure: CESAREAN SECTION;  Surgeon: Lovenia Kim, MD;  Location: [facility];  Service: Gynecology;  Laterality: N/A;  Primary  EDD: 07/15/11/Hx of shoulder dystocia.  Primary cesarean section with delivery of baby boy at 23. Apgars 9/9.   CESAREAN SECTION     2013-2014   CHOLECYSTECTOMY  01/2003   CHOLECYSTECTOMY     HYSTEROTOMY  07/2013   NO PAST SURGERIES     SVD  07/27/1999   fetal demise - shoulder dystocia   WISDOM TOOTH EXTRACTION     Social History   Social History Narrative   ** Merged History Encounter **       Right handed Lives in two story home with husband and 2 children   Immunization History  Administered Date(s) Administered   Influenza,inj,Quad PF,6+ Mos 02/22/2019   PFIZER(Purple Top)SARS-COV-2 Vaccination 01/05/2020, 01/26/2020     Objective: Vital Signs: BP 124/84 (BP Location: Left Arm, Patient Position: Sitting, Cuff Size: Large)   Pulse 77   Ht 5\' 6"  (1.676 m)   Wt 257 lb (116.6 kg)   LMP 11/26/2010   BMI 41.48 kg/m    Physical Exam Vitals and nursing note reviewed.  Constitutional:      Appearance: She is well-developed.  HENT:     Head: Normocephalic and atraumatic.  Eyes:     Conjunctiva/sclera: Conjunctivae normal.  Cardiovascular:     Rate and Rhythm: Normal rate and regular rhythm.     Heart sounds: Normal heart sounds.  Pulmonary:     Effort: Pulmonary effort is normal.     Breath sounds: Normal breath sounds.  Abdominal:     General: Bowel sounds are normal.      Palpations: Abdomen is soft.  Musculoskeletal:     Cervical back: Normal range of motion.  Lymphadenopathy:     Cervical: No cervical adenopathy.  Skin:    General: Skin is warm and dry.     Capillary Refill: Capillary refill takes less than 2 seconds.     Comments: Psoriasis patches were noted on her scalp, upper extremities and trunk.  Neurological:     Mental Status: She is alert and oriented to person, place, and time.  Psychiatric:        Behavior: Behavior normal.     Musculoskeletal Exam: C-spine was in good range of motion.  She had discomfort  range of motion of her thoracic and lumbar spine.  She had tenderness over SI joints.  Shoulder joints, elbow joints, wrist joints, MCPs PIPs and DIPs were in good range of motion with no synovitis.  She had tenderness across her PIP joints.  No nail pitting or dactylitis was noted.  Hip joints were in good range of motion with some discomfort.  She had tenderness over trochanteric area.  Knee joints with good range of motion.  There was no evidence of Achilles tendinitis or plantar fasciitis.  CDAI Exam: CDAI Score: -- Patient Global: --; Provider Global: -- Swollen: --; Tender: -- Joint Exam 04/29/2021   No joint exam has been documented for this visit   There is currently no information documented on the homunculus. Go to the Rheumatology activity and complete the homunculus joint exam.  Investigation: No additional findings.  Imaging: No results found.  Recent Labs: Lab Results  Component Value Date   WBC 8.1 03/28/2021   HGB 13.0 03/28/2021   PLT 375 03/28/2021   NA 140 03/28/2021   K 3.9 03/28/2021   CL 103 03/28/2021   CO2 26 03/28/2021   GLUCOSE 94 03/28/2021   BUN 10 03/28/2021   CREATININE 0.89 03/28/2021   BILITOT 0.3 03/28/2021   ALKPHOS 72 07/15/2020   AST 14 03/28/2021   ALT 18 03/28/2021   PROT 6.5 03/28/2021   PROT 6.8 03/28/2021   ALBUMIN 3.7 07/15/2020   CALCIUM 8.8 03/28/2021   GFRAA >90  06/19/2011   QFTBGOLDPLUS NEGATIVE 03/28/2021   March 28, 2021 SPEP negative, immunoglobulins normal, TB Gold negative, hepatitis B-, hepatitis C negative, G6PD normal, CK 69, sed rate 9, anti-CCP negative, ANA negative   Speciality Comments: Skyrizi-severe bronchitis  Procedures:  No procedures performed Allergies: Azithromycin, Other, Topamax [topiramate], and Vicodin [hydrocodone-acetaminophen]   Assessment / Plan:     Visit Diagnoses: Primary osteoarthritis of both hands - History of intermittent pain and swelling.  No synovitis or dactylitis was noted.  Clinical and radiographic findings were consistent with osteoarthritis.  Lab findings and x-ray findings were discussed with the patient.  A handout on hand exercises was given.  I advised her to contact me in case she develops any increased joint swelling.  Trochanteric bursitis of both hips -she has bilateral trochanteric bursitis which causes discomfort.  A handout on IT band stretches was given.  Plan: Ambulatory referral to Physical Therapy  Primary osteoarthritis of both feet - History of intermittent pain.  Clinical and radiographic findings consistent with osteoarthritis.  No synovitis or dactylitis was noted.  Patient states in the past she has had plantar fasciitis.  She has not had any recent episodes.  Chronic SI joint pain - X-ray showed degenerative changes.  No SI joint narrowing or erosive changes were noted.  Psoriasis - Diagnosed 2006.  She is followed by Dr. Sherrye Payor in [place].  Skyrizi caused severe bronchitis.  Patient does not want MTX or Otezla.  She plans to start [facility] with the dermatologist soon.  DDD (degenerative disc disease), cervical - Patient had MRI in the past. -She continues to have neck pain.  Plan: Ambulatory referral to Physical Therapy  DDD (degenerative disc disease), thoracic - MRI findings consistent with degenerative changes.  She has chronic discomfort.- Plan: Ambulatory referral to  Physical Therapy  Arthropathy of lumbar facet joint - MRI showed facet joint arthropathy.  She has chronic lower back pain.  A handout on back exercises was given.- Plan: Ambulatory referral to Physical Therapy  Fibromyalgia syndrome - History of generalized pain, hyperalgesia and positive tender points.  She is in constant discomfort.  I will refer her to integrative therapies.- Plan: Ambulatory referral to Physical Therapy  Primary hypertension-her blood pressure is normal.  Chronic GERD  History of IBS  Pseudotumor cerebri - Diagnosed 2011.  She is in remission per patient.  Hx of migraines - Followed by Dr. Tomi Likens  PCOS (polycystic ovarian syndrome)  History of depression  Former smoker - 1/4 pack/day for 15 years.  She quit smoking in January 2021.  Orders: Orders Placed This Encounter  Procedures   Ambulatory referral to Physical Therapy    No orders of the defined types were placed in this encounter.    Follow-Up Instructions: Return if symptoms worsen or fail to improve, for Joint pain and psoriasis.   Bo Merino, MD  Note - This record has been created using Editor, commissioning.  Chart creation errors have been sought, but may not always  have been located. Such creation errors do not reflect on  the standard of medical care.

## 2021-04-16 NOTE — Progress Notes (Deleted)
NEUROLOGY FOLLOW UP OFFICE NOTE  Moon Budde [id number]  Assessment/Plan:   Migraine with aura, without status migrainosus, not intractable Paresthesias Excessive daytime somnolence - unable to schedule sleep study History of idiopathic intracranial hypertension  Migraine prevention:  Emgality Migraine rescue:  Nurtec Limit use of pain relievers to no more than 2 days out of week to prevent risk of rebound or medication-overuse headache. Keep headache diary Follow up ***   Subjective:  Banesa Tristan is a 40 year old Caucasian woman with PCOS who follows up for idiopathic intracranial hypertension and neck pain.   UPDATE: She saw ophthalmology, Dr. Manuella Ghazi, on 10/01/2020, whose exam revealed no papilledema.      Typical migraines have continued to be controlled.   Current NSAIDS:  ibuprofen Current analgesics:  none Current triptans:  none Current ergotamine:  none Current anti-emetic:  none Current muscle relaxants:  Flexeril $RemoveB'10mg'nQIYbdzv$  Current anti-anxiolytic:  Xanax Current sleep aide:  none Current Antihypertensive medications:  Metoprolol succinate  (for tachycardia) Current Antidepressant medications:  Prozac $Remov'20mg'bnHlIa$  twice daily Current Anticonvulsant medications:  none Current anti-CGRP: Emgality; Nurtec (rescue) Current Vitamins/Herbal/Supplements:  D3 Current Antihistamines/Decongestants:  none Other therapy:  none Hormone/birth control:  none   Caffeine:  No coffee.  Occasional Coke Alcohol:  rarely Smoker:  2-3 cigarettes daily Diet:  16 oz water daily.  Skips meals.   Exercise:  yes Depression:  improving; Anxiety:  Improving.  Sees a therapist. Other pain:  Neck pain.  Sometimes associated with pain and numbness into the fingers involving either arm) Sleep hygiene:  Some trouble falling asleep   HISTORY:  Onset:  1996 Location:  Left sided from front to back and down the side of neck. Quality:  Sometimes throbbing, sometimes shooting Initial  intensity:  8/10.  She denies new headache, thunderclap headache Aura:  no Premonitory Phase:  lethargy Postdrome:  lethargy Associated symptoms:  Nausea, sometimes vomiting, photophobia, phonophobia, osmophobia, blurred vision/clouding of vision, dizziness.  She denies associated unilateral numbness or weakness. Initial duration:  1 to 3 or 4 days Initial frequency:  Once a week (5 to 10 days a month on average) Initial frequency of abortive medication: maybe once a week Triggers:  Change in weather, emotional stress Relieving factors:  Ice packs Activity:  Aggravates   She was diagnosed with idiopathic intracranial hypertension in 2011.  She presented with severe headaches.  Eye exam revealed papilledema.  She did have prior brain MRI over a decade ago.  She was previously on Diamox and Lasix.  Last LP was in 2012 while pregnant.  No symptoms since then.  Denies visual disturbance other than with headache.     For 10 years, she reports intermittent numbness and tingling various parts of her body and burning on back of neck, shoulders and sometimes down the arms or down the legs.  They often occur a couple of times a week and may last up to 3 hours.  She reports that she has decreased sensation worse on her left side when compared to her right sided.  She saw a spine specialist.  MRI of cervical, thoracic and lumbar spine on 10/20/2019 personally reviewed was unremarkable with minimal spurring at C4-5 and mild lumbar facet hypertrophy but nothing significant that would explain her symptoms.  MRI of brain with and without contrast on 01/22/2020 was normal.  TSH from 03/11/2020 was 2.26.  B12 from November 2021 was 256.  Advised to start B12 1057mcg daily.  No improvement in paresthesias since  starting B12.    She had contracted COVID in early January 2022. She started having dizziness and balance problems.  She had confusion and trouble remembering things such as her phone number.  She started having  new headaches, an intermittent sharp shooting pain in the right parietal region and behind right ear with persistent head pressure and pressure in right ear. Ear would pop.  Has seen her ENT about this.  Endorses generalized pain.  She has had labile hypertension and tachycardia.  She reported blood pressures as high as 188/101 and HR 105-110.  Metoprolol was increased by her PCP yesterday.  She went to the ED on 07/15/2020 where CT of head was performed, which was personally reviewed and was negative.  She was treated with migraine cocktail.     Past NSAIDS:  Aleve Past analgesics:  Tylenol, Excedrin, possibly Fioricet Past abortive triptans:  Sumatriptan [place] (chest tightness/difficulty breathing), Maxalt (ineffective) Past abortive ergotamine:  no Past muscle relaxants:  tizanidine Past anti-emetic:  promethazine Past antihypertensive medications:  no Past antidepressant medications:  Sertraline, maybe amitriptyline, Celexa, Effexor Past anticonvulsant medications:  topiramate (kidney issues) Past anti-CGRP:  Aimovig (ineffective), Ubrelvy 100mg  Past vitamins/Herbal/Supplements:  no Past antihistamines/decongestants:  no Other past therapies:  no     Family history of headache:  Second cousin (IIH, "brain tumor"); great grandfather both had IIH  PAST MEDICAL HISTORY: Past Medical History:  Diagnosis Date   Anxiety    celexa daily   Arthritis    hands/wrist - no meds   Chronic GERD    Depression    celexa daily   Dysrhythmia    hx irregular heart beats - no current prob, stress related.   Eczema    GERD (gastroesophageal reflux disease)    tums prn   Headache(784.0)    OTC prn   History of seasonal allergies    benadryl prn   Hypertension    IBS (irritable bowel syndrome)    IIH (idiopathic intracranial hypertension)    on lasix prior to pregnancy   Major depressive disorder    Migraine    Neck pain    PONV (postoperative nausea and vomiting)    PP care - s/p 1C/S 1/19  06/20/2011   Pseudotumor cerebri    PTSD (post-traumatic stress disorder)    Sinusitis    Strep throat    Tachycardia, unspecified     MEDICATIONS: Current Outpatient Medications on File Prior to Visit  Medication Sig Dispense Refill   albuterol (VENTOLIN HFA) 108 (90 Base) MCG/ACT inhaler Inhale into the lungs.     ALPRAZolam (XANAX) 0.5 MG tablet Take 0.5 mg by mouth 3 (three) times daily as needed for anxiety.     Cholecalciferol (VITAMIN D3 PO) Take 2,000 Int'l Units by mouth daily.     citalopram (CELEXA) 20 MG tablet Take 20 mg by mouth daily.   (Patient not taking: Reported on 03/28/2021)     Crisaborole (EUCRISA) 2 % OINT Eucrisa 2 % topical ointment     Cyanocobalamin 1000 MCG/ML KIT Please inject 1024mcg daily x 7 days, then 1072mcg x one month then monthly after that (Patient not taking: Reported on 03/28/2021) 1 kit 11   cyclobenzaprine (FLEXERIL) 10 MG tablet TAKE 1 TABLET(10 MG) BY MOUTH AT BEDTIME AS NEEDED FOR MUSCLE SPASMS 30 tablet 3   EMGALITY 120 MG/ML SOAJ ADMINISTER 1 ML(120MG ) UNDER THE SKIN EVERY 28 DAYS 1 mL 0   FLUoxetine (PROZAC) 20 MG tablet Take 60 mg by mouth daily.  fluticasone (FLONASE) 50 MCG/ACT nasal spray Place 2 sprays into the nose daily. (Patient not taking: Reported on 03/28/2021) 16 g 0   Fremanezumab-vfrm (AJOVY) 225 MG/1.5ML SOAJ Inject 225 mg into the skin every 28 (twenty-eight) days. (Patient not taking: Reported on 03/28/2021) 1.68 mL 5   ibuprofen (ADVIL) 200 MG tablet Take 800 mg by mouth every 6 (six) hours as needed for mild pain.     Metoprolol Succinate 25 MG CS24 Take 12.5 mg by mouth in the morning and at bedtime. Half tab in am, half tab in pm     pantoprazole (PROTONIX) 40 MG tablet Take 40 mg by mouth daily.     Pseudoephedrine-Guaifenesin (MUCINEX D) [phone] MG TB12 Take 1 tablet by mouth 2 (two) times daily. (Patient not taking: Reported on 03/28/2021) 20 each 0   Rimegepant Sulfate (NURTEC) 75 MG TBDP Take 75 mg by mouth daily  as needed. (Patient taking differently: Take 75 mg by mouth daily as needed (migraine).) 16 tablet 5   tiZANidine (ZANAFLEX) 2 MG tablet TAKE 1 TABLET(2 MG) BY MOUTH AT BEDTIME AS NEEDED FOR MUSCLE SPASMS (Patient taking differently: Take 2 mg by mouth at bedtime as needed for muscle spasms.) 30 tablet 3   traZODone (DESYREL) 50 MG tablet Take 12.5 mg by mouth at bedtime as needed for sleep.     [DISCONTINUED] diphenhydrAMINE (BENADRYL) 25 mg capsule Take 25 mg by mouth every 6 (six) hours as needed. For allergies      No current facility-administered medications on file prior to visit.    ALLERGIES: Allergies  Allergen Reactions   Azithromycin Nausea And Vomiting   Other Nausea And Vomiting    MRI contrast   Topamax [Topiramate] Other (See Comments)    Kidney stones   Vicodin [Hydrocodone-Acetaminophen] Itching    FAMILY HISTORY: Family History  Problem Relation Age of Onset   Diabetes Mother    Heart disease Mother    Asthma Mother    Cancer Mother    Heart attack Father    Diabetes Father    Hyperlipidemia Sister    Diabetes Maternal Grandmother    Diabetes Maternal Grandfather    Migraines Son    Healthy Son    Healthy Daughter       Objective:  *** General: No acute distress.  Patient appears ***-groomed.   Head:  Normocephalic/atraumatic Eyes:  Fundi examined but not visualized Neck: supple, no paraspinal tenderness, full range of motion Heart:  Regular rate and rhythm Lungs:  Clear to auscultation bilaterally Back: No paraspinal tenderness Neurological Exam: alert and oriented to person, place, and time.  Speech fluent and not dysarthric, language intact.  CN II-XII intact. Bulk and tone normal, muscle strength 5/5 throughout.  Sensation to light touch intact.  Deep tendon reflexes 2+ throughout, toes downgoing.  Finger to nose testing intact.  Gait normal, Romberg negative.   Metta Clines, DO  CC: ***

## 2021-04-17 ENCOUNTER — Ambulatory Visit: Payer: 59 | Admitting: Neurology

## 2021-04-29 ENCOUNTER — Other Ambulatory Visit: Payer: Self-pay

## 2021-04-29 ENCOUNTER — Ambulatory Visit: Payer: 59 | Admitting: Rheumatology

## 2021-04-29 ENCOUNTER — Encounter: Payer: Self-pay | Admitting: Rheumatology

## 2021-04-29 VITALS — BP 124/84 | HR 77 | Ht 66.0 in | Wt 257.0 lb

## 2021-04-29 DIAGNOSIS — Z8719 Personal history of other diseases of the digestive system: Secondary | ICD-10-CM | POA: Insufficient documentation

## 2021-04-29 DIAGNOSIS — E282 Polycystic ovarian syndrome: Secondary | ICD-10-CM

## 2021-04-29 DIAGNOSIS — M19042 Primary osteoarthritis, left hand: Secondary | ICD-10-CM

## 2021-04-29 DIAGNOSIS — M19071 Primary osteoarthritis, right ankle and foot: Secondary | ICD-10-CM | POA: Diagnosis not present

## 2021-04-29 DIAGNOSIS — M19041 Primary osteoarthritis, right hand: Secondary | ICD-10-CM | POA: Insufficient documentation

## 2021-04-29 DIAGNOSIS — M7061 Trochanteric bursitis, right hip: Secondary | ICD-10-CM | POA: Diagnosis not present

## 2021-04-29 DIAGNOSIS — Z87891 Personal history of nicotine dependence: Secondary | ICD-10-CM

## 2021-04-29 DIAGNOSIS — M19072 Primary osteoarthritis, left ankle and foot: Secondary | ICD-10-CM

## 2021-04-29 DIAGNOSIS — K219 Gastro-esophageal reflux disease without esophagitis: Secondary | ICD-10-CM

## 2021-04-29 DIAGNOSIS — I10 Essential (primary) hypertension: Secondary | ICD-10-CM

## 2021-04-29 DIAGNOSIS — G932 Benign intracranial hypertension: Secondary | ICD-10-CM

## 2021-04-29 DIAGNOSIS — G8929 Other chronic pain: Secondary | ICD-10-CM

## 2021-04-29 DIAGNOSIS — M5134 Other intervertebral disc degeneration, thoracic region: Secondary | ICD-10-CM

## 2021-04-29 DIAGNOSIS — Z8659 Personal history of other mental and behavioral disorders: Secondary | ICD-10-CM | POA: Insufficient documentation

## 2021-04-29 DIAGNOSIS — M533 Sacrococcygeal disorders, not elsewhere classified: Secondary | ICD-10-CM | POA: Diagnosis not present

## 2021-04-29 DIAGNOSIS — M7062 Trochanteric bursitis, left hip: Secondary | ICD-10-CM

## 2021-04-29 DIAGNOSIS — L409 Psoriasis, unspecified: Secondary | ICD-10-CM | POA: Insufficient documentation

## 2021-04-29 DIAGNOSIS — M47816 Spondylosis without myelopathy or radiculopathy, lumbar region: Secondary | ICD-10-CM | POA: Insufficient documentation

## 2021-04-29 DIAGNOSIS — M5030 Other cervical disc degeneration, unspecified cervical region: Secondary | ICD-10-CM

## 2021-04-29 DIAGNOSIS — M797 Fibromyalgia: Secondary | ICD-10-CM | POA: Insufficient documentation

## 2021-04-29 DIAGNOSIS — Z8669 Personal history of other diseases of the nervous system and sense organs: Secondary | ICD-10-CM

## 2021-04-29 NOTE — Patient Instructions (Signed)
Hand Exercises °Hand exercises can be helpful for almost anyone. These exercises can strengthen the hands, improve flexibility and movement, and increase blood flow to the hands. These results can make work and daily tasks easier. °Hand exercises can be especially helpful for people who have joint pain from arthritis or have nerve damage from overuse (carpal tunnel syndrome). °These exercises can also help people who have injured a hand. °Exercises °Most of these hand exercises are gentle stretching and motion exercises. It is usually safe to do them often throughout the day. Warming up your hands before exercise may help to reduce stiffness. You can do this with gentle massage or by placing your hands in warm water for 10-15 minutes. °It is normal to feel some stretching, pulling, tightness, or mild discomfort as you begin new exercises. This will gradually improve. Stop an exercise right away if you feel sudden, severe pain or your pain gets worse. Ask your health care provider which exercises are best for you. °Knuckle bend or "claw" fist ° °Stand or sit with your arm, hand, and all five fingers pointed straight up. Make sure to keep your wrist straight during the exercise. °Gently bend your fingers down toward your palm until the tips of your fingers are touching the top of your palm. Keep your big knuckle straight and just bend the small knuckles in your fingers. °Hold this position for __________ seconds. °Straighten (extend) your fingers back to the starting position. °Repeat this exercise 5-10 times with each hand. °Full finger fist ° °Stand or sit with your arm, hand, and all five fingers pointed straight up. Make sure to keep your wrist straight during the exercise. °Gently bend your fingers into your palm until the tips of your fingers are touching the middle of your palm. °Hold this position for __________ seconds. °Extend your fingers back to the starting position, stretching every joint fully. °Repeat  this exercise 5-10 times with each hand. °Straight fist °Stand or sit with your arm, hand, and all five fingers pointed straight up. Make sure to keep your wrist straight during the exercise. °Gently bend your fingers at the big knuckle, where your fingers meet your hand, and the middle knuckle. Keep the knuckle at the tips of your fingers straight and try to touch the bottom of your palm. °Hold this position for __________ seconds. °Extend your fingers back to the starting position, stretching every joint fully. °Repeat this exercise 5-10 times with each hand. °Tabletop ° °Stand or sit with your arm, hand, and all five fingers pointed straight up. Make sure to keep your wrist straight during the exercise. °Gently bend your fingers at the big knuckle, where your fingers meet your hand, as far down as you can while keeping the small knuckles in your fingers straight. Think of forming a tabletop with your fingers. °Hold this position for __________ seconds. °Extend your fingers back to the starting position, stretching every joint fully. °Repeat this exercise 5-10 times with each hand. °Finger spread ° °Place your hand flat on a table with your palm facing down. Make sure your wrist stays straight as you do this exercise. °Spread your fingers and thumb apart from each other as far as you can until you feel a gentle stretch. Hold this position for __________ seconds. °Bring your fingers and thumb tight together again. Hold this position for __________ seconds. °Repeat this exercise 5-10 times with each hand. °Making circles ° °Stand or sit with your arm, hand, and all five fingers pointed   straight up. Make sure to keep your wrist straight during the exercise. °Make a circle by touching the tip of your thumb to the tip of your index finger. °Hold for __________ seconds. Then open your hand wide. °Repeat this motion with your thumb and each finger on your hand. °Repeat this exercise 5-10 times with each hand. °Thumb  motion ° °Sit with your forearm resting on a table and your wrist straight. Your thumb should be facing up toward the ceiling. Keep your fingers relaxed as you move your thumb. °Lift your thumb up as high as you can toward the ceiling. Hold for __________ seconds. °Bend your thumb across your palm as far as you can, reaching the tip of your thumb for the small finger (pinkie) side of your palm. Hold for __________ seconds. °Repeat this exercise 5-10 times with each hand. °Grip strengthening ° °Hold a stress ball or other soft ball in the middle of your hand. °Slowly increase the pressure, squeezing the ball as much as you can without causing pain. Think of bringing the tips of your fingers into the middle of your palm. All of your finger joints should bend when doing this exercise. °Hold your squeeze for __________ seconds, then relax. °Repeat this exercise 5-10 times with each hand. °Contact a health care provider if: °Your hand pain or discomfort gets much worse when you do an exercise. °Your hand pain or discomfort does not improve within 2 hours after you exercise. °If you have any of these problems, stop doing these exercises right away. Do not do them again unless your health care provider says that you can. °Get help right away if: °You develop sudden, severe hand pain or swelling. If this happens, stop doing these exercises right away. Do not do them again unless your health care provider says that you can. °This information is not intended to replace advice given to you by your health care provider. Make sure you discuss any questions you have with your health care provider. °Document Revised: 09/05/2020 Document Reviewed: 09/05/2020 °Elsevier Patient Education © 2022 [employer] °Iliotibial Band Syndrome Rehab °Ask your health care provider which exercises are safe for you. Do exercises exactly as told by your health care provider and adjust them as directed. It is normal to feel mild stretching,  pulling, tightness, or discomfort as you do these exercises. Stop right away if you feel sudden pain or your pain gets significantly worse. Do not begin these exercises until told by your health care provider. °Stretching and range-of-motion exercises °These exercises warm up your muscles and joints and improve the movement and flexibility of your hip and pelvis. °Quadriceps stretch, prone ° °Lie on your abdomen (prone position) on a firm surface, such as a bed or padded floor. °Bend your left / right knee and reach back to hold your ankle or pant leg. If you cannot reach your ankle or pant leg, loop a belt around your foot and grab the belt instead. °Gently pull your heel toward your buttocks. Your knee should not slide out to the side. You should feel a stretch in the front of your thigh and knee (quadriceps). °Hold this position for __________ seconds. °Repeat __________ times. Complete this exercise __________ times a day. °Iliotibial band stretch °An iliotibial band is a strong band of muscle tissue that runs from the outer side of your hip to the outer side of your thigh and knee. °Lie on your side with your left / right leg in the top   position. °Bend both of your knees and grab your left / right ankle. Stretch out your bottom arm to help you balance. °Slowly bring your top knee back so your thigh goes behind your trunk. °Slowly lower your top leg toward the floor until you feel a gentle stretch on the outside of your left / right hip and thigh. If you do not feel a stretch and your knee will not fall farther, place the heel of your other foot on top of your knee and pull your knee down toward the floor with your foot. °Hold this position for __________ seconds. °Repeat __________ times. Complete this exercise __________ times a day. °Strengthening exercises °These exercises build strength and endurance in your hip and pelvis. Endurance is the ability to use your muscles for a long time, even after they get  tired. °Straight leg raises, side-lying °This exercise strengthens the muscles that rotate the leg at the hip and move it away from your body (hip abductors). °Lie on your side with your left / right leg in the top position. Lie so your head, shoulder, hip, and knee line up. You may bend your bottom knee to help you balance. °Roll your hips slightly forward so your hips are stacked directly over each other and your left / right knee is facing forward. °Tense the muscles in your outer thigh and lift your top leg 4-6 inches (10-15 cm). °Hold this position for __________ seconds. °Slowly lower your leg to return to the starting position. Let your muscles relax completely before doing another repetition. °Repeat __________ times. Complete this exercise __________ times a day. °Leg raises, prone °This exercise strengthens the muscles that move the hips backward (hip extensors). °Lie on your abdomen (prone position) on your bed or a firm surface. You can put a pillow under your hips if that is more comfortable for your lower back. °Bend your left / right knee so your foot is straight up in the air. °Squeeze your buttocks muscles and lift your left / right thigh off the bed. Do not let your back arch. °Tense your thigh muscle as hard as you can without increasing any knee pain. °Hold this position for __________ seconds. °Slowly lower your leg to return to the starting position and allow it to relax completely. °Repeat __________ times. Complete this exercise __________ times a day. °Hip hike °Stand sideways on a bottom step. Stand on your left / right leg with your other foot unsupported next to the step. You can hold on to a railing or wall for balance if needed. °Keep your knees straight and your torso square. Then lift your left / right hip up toward the ceiling. °Slowly let your left / right hip lower toward the floor, past the starting position. Your foot should get closer to the floor. Do not lean or bend your  knees. °Repeat __________ times. Complete this exercise __________ times a day. °This information is not intended to replace advice given to you by your health care provider. Make sure you discuss any questions you have with your health care provider. °Document Revised: 07/26/2019 Document Reviewed: 07/26/2019 °Elsevier Patient Education © 2022 [employer] °Back Exercises °The following exercises strengthen the muscles that help to support the trunk (torso) and back. They also help to keep the lower back flexible. Doing these exercises can help to prevent or lessen existing low back pain. °If you have back pain or discomfort, try doing these exercises 2-3 times each day or as told by   your health care provider. °As your pain improves, do them once each day, but increase the number of times that you repeat the steps for each exercise (do more repetitions). °To prevent the recurrence of back pain, continue to do these exercises once each day or as told by your health care provider. °Do exercises exactly as told by your health care provider and adjust them as directed. It is normal to feel mild stretching, pulling, tightness, or discomfort as you do these exercises, but you should stop right away if you feel sudden pain or your pain gets worse. °Exercises °Single knee to chest °Repeat these steps 3-5 times for each leg: °Lie on your back on a firm bed or the floor with your legs extended. °Bring one knee to your chest. Your other leg should stay extended and in contact with the floor. °Hold your knee in place by grabbing your knee or thigh with both hands and hold. °Pull on your knee until you feel a gentle stretch in your lower back or buttocks. °Hold the stretch for 10-30 seconds. °Slowly release and straighten your leg. ° °Pelvic tilt °Repeat these steps 5-10 times: °Lie on your back on a firm bed or the floor with your legs extended. °Bend your knees so they are pointing toward the ceiling and your feet are flat  on the floor. °Tighten your lower abdominal muscles to press your lower back against the floor. This motion will tilt your pelvis so your tailbone points up toward the ceiling instead of pointing to your feet or the floor. °With gentle tension and even breathing, hold this position for 5-10 seconds. ° °Cat-cow °Repeat these steps until your lower back becomes more flexible: °Get into a hands-and-knees position on a firm bed or the floor. Keep your hands under your shoulders, and keep your knees under your hips. You may place padding under your knees for comfort. °Let your head hang down toward your chest. Contract your abdominal muscles and point your tailbone toward the floor so your lower back becomes rounded like the back of a cat. °Hold this position for 5 seconds. °Slowly lift your head, let your abdominal muscles relax, and point your tailbone up toward the ceiling so your back forms a sagging arch like the back of a cow. °Hold this position for 5 seconds. ° °Press-ups °Repeat these steps 5-10 times: °Lie on your abdomen (face-down) on a firm bed or the floor. °Place your palms near your head, about shoulder-width apart. °Keeping your back as relaxed as possible and keeping your hips on the floor, slowly straighten your arms to raise the top half of your body and lift your shoulders. Do not use your back muscles to raise your upper torso. You may adjust the placement of your hands to make yourself more comfortable. °Hold this position for 5 seconds while you keep your back relaxed. °Slowly return to lying flat on the floor. ° °Bridges °Repeat these steps 10 times: °Lie on your back on a firm bed or the floor. °Bend your knees so they are pointing toward the ceiling and your feet are flat on the floor. Your arms should be flat at your sides, next to your body. °Tighten your buttocks muscles and lift your buttocks off the floor until your waist is at almost the same height as your knees. You should feel the  muscles working in your buttocks and the back of your thighs. If you do not feel these muscles, slide your feet 1-2 inches (  2.5-5 cm) farther away from your buttocks. °Hold this position for 3-5 seconds. °Slowly lower your hips to the starting position, and allow your buttocks muscles to relax completely. °If this exercise is too easy, try doing it with your arms crossed over your chest. °Abdominal crunches °Repeat these steps 5-10 times: °Lie on your back on a firm bed or the floor with your legs extended. °Bend your knees so they are pointing toward the ceiling and your feet are flat on the floor. °Cross your arms over your chest. °Tip your chin slightly toward your chest without bending your neck. °Tighten your abdominal muscles and slowly raise your torso high enough to lift your shoulder blades a tiny bit off the floor. Avoid raising your torso higher than that because it can put too much stress on your lower back and does not help to strengthen your abdominal muscles. °Slowly return to your starting position. ° °Back lifts °Repeat these steps 5-10 times: °Lie on your abdomen (face-down) with your arms at your sides, and rest your forehead on the floor. °Tighten the muscles in your legs and your buttocks. °Slowly lift your chest off the floor while you keep your hips pressed to the floor. Keep the back of your head in line with the curve in your back. Your eyes should be looking at the floor. °Hold this position for 3-5 seconds. °Slowly return to your starting position. ° °Contact a health care provider if: °Your back pain or discomfort gets much worse when you do an exercise. °Your worsening back pain or discomfort does not lessen within 2 hours after you exercise. °If you have any of these problems, stop doing these exercises right away. Do not do them again unless your health care provider says that you can. °Get help right away if: °You develop sudden, severe back pain. If this happens, stop doing the  exercises right away. Do not do them again unless your health care provider says that you can. °This information is not intended to replace advice given to you by your health care provider. Make sure you discuss any questions you have with your health care provider. °Document Revised: 11/12/2020 Document Reviewed: 07/31/2020 °Elsevier Patient Education © 2022 [employer] ° °

## 2021-06-27 DIAGNOSIS — F5101 Primary insomnia: Secondary | ICD-10-CM | POA: Insufficient documentation

## 2021-07-08 ENCOUNTER — Telehealth: Payer: Self-pay

## 2021-07-08 NOTE — Telephone Encounter (Signed)
New message   Your information has been sent to [facility].  [name] [name] (Key: Earlene Plater) Emgality 120MG /ML auto-injectors (migraine)   Form [facility] Electronic Prior Authorization Form (2017 [employer]) Created 2 minutes ago Sent to Plan 1 minute ago Plan Response 1 minute ago Submit Clinical Questions less than a minute ago Determination Wait for Determination Please wait for [facility] 2017 NCP[employer] to return a determination.

## 2021-07-08 NOTE — Telephone Encounter (Signed)
F/u  [name] (Key: [id number]) Emgality 120MG /ML auto-injectors (migraine)   Form [facility] Electronic Prior Authorization Form (2017 NCPDP) Created 3 hours ago Sent to Plan 3 hours ago Plan Response 3 hours ago Submit Clinical Questions 3 hours ago Determination Favorable 3 hours ago Message from Plan Request Reference Number: [id number]. EMGALITY INJ 120MG /ML is approved through 07/08/2022. Your patient may now fill this prescription and it will be covered.

## 2021-07-10 ENCOUNTER — Telehealth: Payer: Self-pay | Admitting: Rheumatology

## 2021-07-10 NOTE — Telephone Encounter (Signed)
Patient called the office requesting her most recent lab results be sent to [facility] in [place]. 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  Fax [phone]

## 2021-07-10 NOTE — Telephone Encounter (Signed)
Labs faxed to dermatology as requested.

## 2021-10-09 NOTE — Progress Notes (Signed)
? ?NEUROLOGY FOLLOW UP OFFICE NOTE ? ?[name] ?[id number] ? ?Assessment/Plan:  ? ?1.  Migraine with aura, without status migrainosus, not intractable ?2.  Paresthesias ? ?Migraine prevention:  Will restart Emgality ?Migraine rescue:  Nurtec ?Limit use of pain relievers to no more than 2 days out of week to prevent risk of rebound or medication-overuse headache. ?Keep headache diary ?NCV-EMG ?Follow up 6 months. ? ?  ?Subjective:  ?[name] is a 41 year old Caucasian woman with PCOS who follows up for migraines ?  ?UPDATE: ?Last seen in February 2022. ?For workup, ordered NCV-EMG.  This had not been done. ?She saw Dr. Manuella Ghazi of ophthalmology in May 2022 whose exam did not demonstrate papilledema or VF deficits. ?Migraines were controlled on Emgality.  She was off Emgality due to problem with insurance.  Headaches have returned ?She is seeing psychiatry for depression, anxiety and PTSD. ?In last 30 days:  1 migraine a week, lasting 3 hours with Nurtec. ?  ?Current NSAIDS:  ibuprofen ?Current analgesics:  none ?Current triptans:  none ?Current ergotamine:  none ?Current anti-emetic:  none ?Current muscle relaxants:  Flexeril 90m ?Current anti-anxiolytic:  Xanax ?Current sleep aide:  none ?Current Antihypertensive medications:  Metoprolol succinate  (for tachycardia) ?Current Antidepressant medications:  Prozac 284mtwice daily ?Current Anticonvulsant medications:  none ?Current anti-CGRP: Emgality; Nurtec (rescue) ?Current Vitamins/Herbal/Supplements:  D3 ?Current Antihistamines/Decongestants:  none ?Other therapy:  none ?Hormone/birth control:  none ?  ?Caffeine:  No coffee.  Occasional Coke ?Alcohol:  rarely ?Smoker:  2-3 cigarettes daily ?Diet:  16 oz water daily.  Skips meals.   ?Exercise:  yes ?Depression:  improving; Anxiety:  Improving.  Sees a therapist. ?Other pain:  Neck pain.  Sometimes associated with pain and numbness into the fingers involving either arm) ?Sleep hygiene:  Some trouble  falling asleep ?  ?HISTORY:  ?Onset:  1996 ?Location:  Left sided from front to back and down the side of neck. ?Quality:  Sometimes throbbing, sometimes shooting ?Initial intensity:  8/10.  She denies new headache, thunderclap headache ?Aura:  no ?Premonitory Phase:  lethargy ?Postdrome:  lethargy ?Associated symptoms:  Nausea, sometimes vomiting, photophobia, phonophobia, osmophobia, blurred vision/clouding of vision, dizziness.  She denies associated unilateral numbness or weakness. ?Initial duration:  1 to 3 or 4 days ?Initial frequency:  Once a week (5 to 10 days a month on average) ?Initial frequency of abortive medication: maybe once a week ?Triggers:  Change in weather, emotional stress ?Relieving factors:  Ice packs ?Activity:  Aggravates ?  ?She was diagnosed with idiopathic intracranial hypertension in 2011.  She presented with severe headaches.  Eye exam revealed papilledema.  She did have prior brain MRI over a decade ago.  She was previously on Diamox and Lasix.  Last LP was in 2012 while pregnant.  No symptoms since then.  Denies visual disturbance other than with headache.   ? ?She had contracted COVID in early January 2022. She started having dizziness and balance problems.  She had confusion and trouble remembering things such as her phone number.  She started having new headaches, an intermittent sharp shooting pain in the right parietal region and behind right ear with persistent head pressure and pressure in right ear. Ear would pop.  Has seen her ENT about this.  Endorses generalized pain.  She has had labile hypertension and tachycardia.  She reported blood pressures as high as 188/101 and HR 105-110.  Metoprolol was increased by her PCP yesterday.  She went  to the ED on 07/15/2020 where CT of head was performed, which was personally reviewed and was negative.  ?  ?For 10 years, she reports intermittent numbness and tingling various parts of her body and burning on back of neck, shoulders and  sometimes down the arms or down the legs.  They often occur a couple of times a week and may last up to 3 hours.  She reports that she has decreased sensation worse on her left side when compared to her right sided.  She saw a spine specialist.  MRI of cervical, thoracic and lumbar spine on 10/20/2019 personally reviewed was unremarkable with minimal spurring at C4-5 and mild lumbar facet hypertrophy but nothing significant that would explain her symptoms.  MRI of brain with and without contrast on 01/22/2020 was normal.  TSH from 03/11/2020 was 2.26.  B12 from November was 256.  Advised to start B12 1083mg daily.  No improvement in paresthesias since starting B12.  ?  ?Past NSAIDS:  Aleve ?Past analgesics:  Tylenol, Excedrin, possibly Fioricet ?Past abortive triptans:  Sumatriptan [place] (chest tightness/difficulty breathing), Maxalt (ineffective) ?Past abortive ergotamine:  no ?Past muscle relaxants:  tizanidine ?Past anti-emetic:  promethazine ?Past antihypertensive medications:  no ?Past antidepressant medications:  Sertraline, maybe amitriptyline, Celexa, Effexor ?Past anticonvulsant medications:  topiramate (kidney issues) ?Past anti-CGRP:  Aimovig (ineffective), Ubrelvy 1091m?Past vitamins/Herbal/Supplements:  no ?Past antihistamines/decongestants:  no ?Other past therapies:  no ?  ?  ?Family history of headache:  Second cousin (IIH, "brain tumor"); great grandfather both had IIH ? ?PAST MEDICAL HISTORY: ?Past Medical History:  ?Diagnosis Date  ? Anxiety   ? celexa daily  ? Arthritis   ? hands/wrist - no meds  ? Chronic GERD   ? Depression   ? celexa daily  ? Dysrhythmia   ? hx irregular heart beats - no current prob, stress related.  ? Eczema   ? GERD (gastroesophageal reflux disease)   ? tums prn  ? Headache(784.0)   ? OTC prn  ? History of seasonal allergies   ? benadryl prn  ? Hypertension   ? IBS (irritable bowel syndrome)   ? IIH (idiopathic intracranial hypertension)   ? on lasix prior to pregnancy  ?  Major depressive disorder   ? Migraine   ? Neck pain   ? PONV (postoperative nausea and vomiting)   ? PP care - s/p 1C/S 1/19 06/20/2011  ? Pseudotumor cerebri   ? PTSD (post-traumatic stress disorder)   ? Sinusitis   ? Strep throat   ? Tachycardia, unspecified   ? ? ?MEDICATIONS: ?Current Outpatient Medications on File Prior to Visit  ?Medication Sig Dispense Refill  ? albuterol (VENTOLIN HFA) 108 (90 Base) MCG/ACT inhaler Inhale into the lungs.    ? ALPRAZolam (XANAX) 0.5 MG tablet Take 0.5 mg by mouth 3 (three) times daily as needed for anxiety.    ? Cholecalciferol (VITAMIN D3 PO) Take 2,000 Int'l Units by mouth daily.    ? citalopram (CELEXA) 20 MG tablet Take 20 mg by mouth daily.   (Patient not taking: Reported on 03/28/2021)    ? Crisaborole (EUCRISA) 2 % OINT Eucrisa 2 % topical ointment    ? Cyanocobalamin 1000 MCG/ML KIT Please inject 100043mdaily x 7 days, then 1000m57m one month then monthly after that (Patient not taking: Reported on 03/28/2021) 1 kit 11  ? cyclobenzaprine (FLEXERIL) 10 MG tablet TAKE 1 TABLET(10 MG) BY MOUTH AT BEDTIME AS NEEDED FOR MUSCLE SPASMS 30 tablet 3  ?  EMGALITY 120 MG/ML SOAJ ADMINISTER 1 ML(120MG) UNDER THE SKIN EVERY 28 DAYS 1 mL 0  ? FLUoxetine (PROZAC) 20 MG tablet Take 60 mg by mouth daily.    ? fluticasone (FLONASE) 50 MCG/ACT nasal spray Place 2 sprays into the nose daily. (Patient not taking: Reported on 03/28/2021) 16 g 0  ? Fremanezumab-vfrm (AJOVY) 225 MG/1.5ML SOAJ Inject 225 mg into the skin every 28 (twenty-eight) days. (Patient not taking: Reported on 03/28/2021) 1.68 mL 5  ? ibuprofen (ADVIL) 200 MG tablet Take 800 mg by mouth every 6 (six) hours as needed for mild pain.    ? Metoprolol Succinate 25 MG CS24 Take 12.5 mg by mouth in the morning and at bedtime. Half tab in am, half tab in pm    ? pantoprazole (PROTONIX) 40 MG tablet Take 40 mg by mouth daily.    ? Pseudoephedrine-Guaifenesin (MUCINEX D) [phone] MG TB12 Take 1 tablet by mouth 2 (two) times  daily. (Patient not taking: Reported on 03/28/2021) 20 each 0  ? Rimegepant Sulfate (NURTEC) 75 MG TBDP Take 75 mg by mouth daily as needed. (Patient taking differently: Take 75 mg by mouth daily as needed (migr

## 2021-10-10 ENCOUNTER — Encounter: Payer: Self-pay | Admitting: Neurology

## 2021-10-10 ENCOUNTER — Ambulatory Visit: Payer: 59 | Admitting: Neurology

## 2021-10-10 VITALS — BP 148/82 | HR 96 | Resp 18 | Ht 66.5 in | Wt 262.0 lb

## 2021-10-10 DIAGNOSIS — G43109 Migraine with aura, not intractable, without status migrainosus: Secondary | ICD-10-CM

## 2021-10-10 DIAGNOSIS — R202 Paresthesia of skin: Secondary | ICD-10-CM

## 2021-10-10 DIAGNOSIS — Z90710 Acquired absence of both cervix and uterus: Secondary | ICD-10-CM | POA: Insufficient documentation

## 2021-10-10 DIAGNOSIS — B977 Papillomavirus as the cause of diseases classified elsewhere: Secondary | ICD-10-CM | POA: Insufficient documentation

## 2021-10-10 DIAGNOSIS — D069 Carcinoma in situ of cervix, unspecified: Secondary | ICD-10-CM | POA: Insufficient documentation

## 2021-10-10 DIAGNOSIS — N83202 Unspecified ovarian cyst, left side: Secondary | ICD-10-CM | POA: Insufficient documentation

## 2021-10-10 DIAGNOSIS — R87610 Atypical squamous cells of undetermined significance on cytologic smear of cervix (ASC-US): Secondary | ICD-10-CM | POA: Insufficient documentation

## 2021-10-10 DIAGNOSIS — D072 Carcinoma in situ of vagina: Secondary | ICD-10-CM | POA: Insufficient documentation

## 2021-10-10 MED ORDER — EMGALITY 120 MG/ML ~~LOC~~ SOAJ: 120.0000 mg | SUBCUTANEOUS | 5 refills | Status: DC

## 2021-10-10 MED ORDER — EMGALITY 120 MG/ML ~~LOC~~ SOAJ: 240.0000 mg | Freq: Once | SUBCUTANEOUS | 0 refills | Status: AC

## 2021-10-10 NOTE — Patient Instructions (Signed)
Restart Emgality - 2 injections for first dose, then 1 injection every 28 days thereafter ?Nurtec as needed (maximum 1 tablet in 24 hours) ?Nerve conduction study-EMG of right arm and leg  ?Keep headache diary ?Follow up 6 months. ?

## 2021-10-13 ENCOUNTER — Other Ambulatory Visit (HOSPITAL_COMMUNITY): Payer: Self-pay

## 2021-10-17 ENCOUNTER — Other Ambulatory Visit: Payer: Self-pay | Admitting: Neurology

## 2021-11-18 ENCOUNTER — Telehealth: Payer: Self-pay | Admitting: Neurology

## 2021-11-18 MED ORDER — NURTEC 75 MG PO TBDP: 75.0000 mg | ORAL_TABLET | Freq: Every day | ORAL | 5 refills | Status: DC | PRN

## 2021-11-18 NOTE — Telephone Encounter (Signed)
Patient called to see about nurtec, she said that there was an issue with [facility]?  She also stated that she is losing her insurance at the end of the month and she wanted to see if she could get samples for emgality for July and August until she gets her new insurance in September.

## 2021-11-18 NOTE — Telephone Encounter (Signed)
Per Dr.Jaffe okay to pick up samples. Patient to come  by Friday of this week.

## 2021-12-12 NOTE — Telephone Encounter (Signed)
Patient Presents in the office for Samples.  Medication Samples have been provided to the patient.  Drug name: Emaglity       Strength: 120 mg        Qty: 2  LOT: [id number]  Exp.Date: 06/09/23  Dosing instructions: Every 28 days  The patient has been instructed regarding the correct time, dose, and frequency of taking this medication, including desired effects and most common side effects.   Leida Lauth 1:02 PM 12/12/2021

## 2021-12-12 NOTE — Addendum Note (Signed)
Addended by: Leida Lauth on: 12/12/2021 01:03 PM   Modules accepted: Orders

## 2022-02-06 DIAGNOSIS — R1310 Dysphagia, unspecified: Secondary | ICD-10-CM | POA: Diagnosis not present

## 2022-02-06 DIAGNOSIS — K529 Noninfective gastroenteritis and colitis, unspecified: Secondary | ICD-10-CM | POA: Diagnosis not present

## 2022-02-06 DIAGNOSIS — Z0000 Encounter for general adult medical examination without abnormal findings: Secondary | ICD-10-CM | POA: Diagnosis not present

## 2022-02-06 DIAGNOSIS — K219 Gastro-esophageal reflux disease without esophagitis: Secondary | ICD-10-CM | POA: Diagnosis not present

## 2022-02-13 DIAGNOSIS — Z0000 Encounter for general adult medical examination without abnormal findings: Secondary | ICD-10-CM | POA: Diagnosis not present

## 2022-02-13 DIAGNOSIS — E559 Vitamin D deficiency, unspecified: Secondary | ICD-10-CM | POA: Diagnosis not present

## 2022-02-13 DIAGNOSIS — Z131 Encounter for screening for diabetes mellitus: Secondary | ICD-10-CM | POA: Diagnosis not present

## 2022-02-13 DIAGNOSIS — Z13220 Encounter for screening for lipoid disorders: Secondary | ICD-10-CM | POA: Diagnosis not present

## 2022-02-19 ENCOUNTER — Ambulatory Visit: Payer: BC Managed Care – PPO | Admitting: Neurology

## 2022-02-19 DIAGNOSIS — R202 Paresthesia of skin: Secondary | ICD-10-CM | POA: Diagnosis not present

## 2022-02-19 NOTE — Procedures (Signed)
[facility]  [place], [place] [address]  [place], [place] [address] Tel: [phone] Fax:  [phone] Test Date:  02/19/2022  Patient: [name] DOB: Nov 16, 1980 Physician: Narda Amber, DO  Sex: Female Height: 5\' 6"  Ref Phys: Metta Clines, D.O.  ID#: [id number]   Technician:    Patient Complaints: This is a 41 year old female referred for evaluation of generalized paresthesias.  NCV & EMG Findings: Extensive electrodiagnostic testing of the right upper extremity and additional studies of the left shows:  All sensory responses including the right median, ulnar, mixed palmar, sural, and superficial peroneal nerves are within normal limits. All motor responses including the right median, ulnar, peroneal, and tibial nerves are within normal limits. Right tibial H reflex study is within normal limits. There is no evidence of active or chronic motor axonal loss changes affecting any of the tested muscles.  Motor unit configuration and recruitment pattern is within normal limits.  Impression: This is a normal study of the right upper and lower extremities.  In particular, there is no evidence of a sensorimotor polyneuropathy or cervical/lumbosacral radiculopathy.   ___________________________ Narda Amber, DO    Nerve Conduction Studies Anti Sensory Summary Table   Stim Site NR Peak (ms) Norm Peak (ms) O-P Amp (V) Norm O-P Amp  Right Median Anti Sensory (2nd Digit)  32C  Wrist    2.7 <3.4 40.4 >20  Right Sup Peroneal Anti Sensory (Ant Lat Mall)  32C  12 cm    2.2 <4.5 14.2 >5  Right Sural Anti Sensory (Lat Mall)  32C  Calf    3.0 <4.5 11.0 >5  Right Ulnar Anti Sensory (5th Digit)  32C  Wrist    2.4 <3.1 39.5 >12   Motor Summary Table   Stim Site NR Onset (ms) Norm Onset (ms) O-P Amp (mV) Norm O-P Amp Site1 Site2 Delta-0 (ms) Dist (cm) Vel (m/s) Norm Vel (m/s)  Right Median Motor (Abd Poll Brev)  32C  Wrist    2.5 <3.9 12.4 >6 Elbow Wrist 4.5 28.0 62 >50   Elbow    7.0  11.8         Right Peroneal Motor (Ext Dig Brev)  32C  Ankle    2.0 <5.5 7.7 >3 B Fib Ankle 8.1 39.0 48 >40  B Fib    10.1  7.0  Poplt B Fib 1.3 8.0 62 >40  Poplt    11.4  6.9         Right Tibial Motor (Abd Hall Brev)  32C  Ankle    2.8 <6.0 16.2 >8 Knee Ankle 7.7 45.0 58 >40  Knee    10.5  12.7         Right Ulnar Motor (Abd Dig Minimi)  32C  Wrist    1.7 <3.1 10.1 >7 B Elbow Wrist 3.5 22.0 63 >50  B Elbow    5.2  9.3  A Elbow B Elbow 1.8 10.0 56 >50  A Elbow    7.0  9.1          Comparison Summary Table   Stim Site NR Peak (ms) Norm Peak (ms) P-T Amp (V) Site1 Site2 Delta-P (ms) Norm Delta (ms)  Right Median/Ulnar Palm Comparison (Wrist - 8cm)  32C  Median Palm    1.6 <2.2 92.1 Median Palm Ulnar Palm 0.3   Ulnar Palm    1.3 <2.2 21.7       H Reflex Studies   NR H-Lat (ms) Lat Norm (ms) L-R  H-Lat (ms)  Right Tibial (Gastroc)  32C     31.02 <35    EMG   Side Muscle Ins Act Fibs Fasc Recrt Dur. Amp. Poly. Activation Comment  Right 1stDorInt Nml Nml Nml Nml Nml Nml Nml Nml N/A  Right PronatorTeres Nml Nml Nml Nml Nml Nml Nml Nml N/A  Right Biceps Nml Nml Nml Nml Nml Nml Nml Nml N/A  Right Triceps Nml Nml Nml Nml Nml Nml Nml Nml N/A  Right Deltoid Nml Nml Nml Nml Nml Nml Nml Nml N/A  Right AntTibialis Nml Nml Nml Nml Nml Nml Nml Nml N/A  Right Gastroc Nml Nml Nml Nml Nml Nml Nml Nml N/A  Right Flex Dig Long Nml Nml Nml Nml Nml Nml Nml Nml N/A  Right RectFemoris Nml Nml Nml Nml Nml Nml Nml Nml N/A  Right GluteusMed Nml Nml Nml Nml Nml Nml Nml Nml N/A      Waveforms:

## 2022-03-24 DIAGNOSIS — Z87891 Personal history of nicotine dependence: Secondary | ICD-10-CM | POA: Diagnosis not present

## 2022-03-24 DIAGNOSIS — R0609 Other forms of dyspnea: Secondary | ICD-10-CM | POA: Diagnosis not present

## 2022-03-24 DIAGNOSIS — R911 Solitary pulmonary nodule: Secondary | ICD-10-CM | POA: Diagnosis not present

## 2022-03-25 IMAGING — MG MM BREAST BX W LOC DEV 1ST LESION IMAGE BX SPEC STEREO GUIDE*L*
8 of 18 series · 8 of 38 positions shown · non-contrast
Comparison: Previous exams.
COMPARISON: Previous exams.

Addendum:
CLINICAL DATA: 39-year-old female for tissue sampling of 0.6 cm
UPPER INNER LEFT breast mass.

EXAM:
LEFT BREAST STEREOTACTIC CORE NEEDLE BIOPSY

[L (1 of 8)]
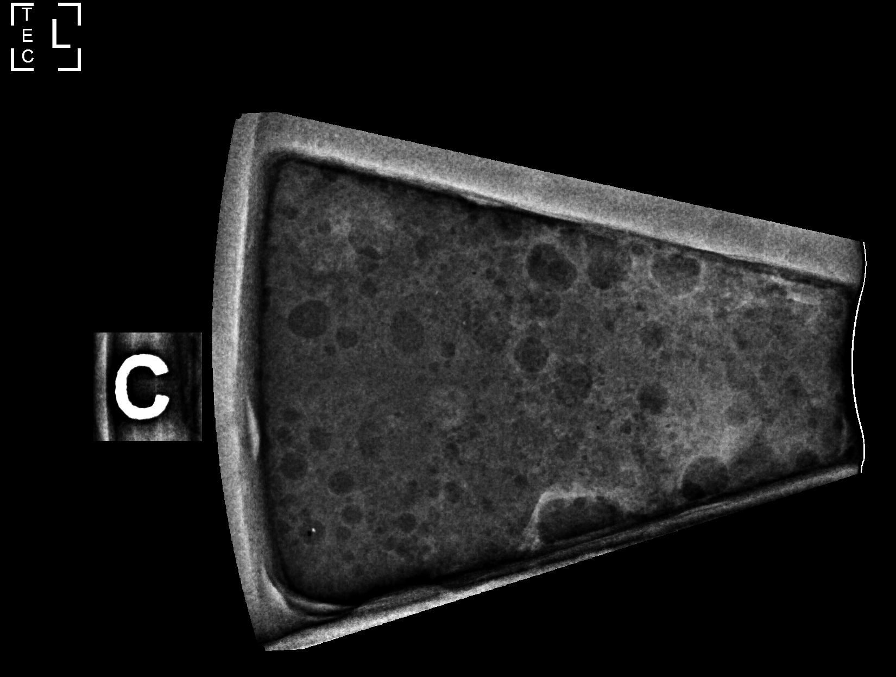

[L (2 of 8)]
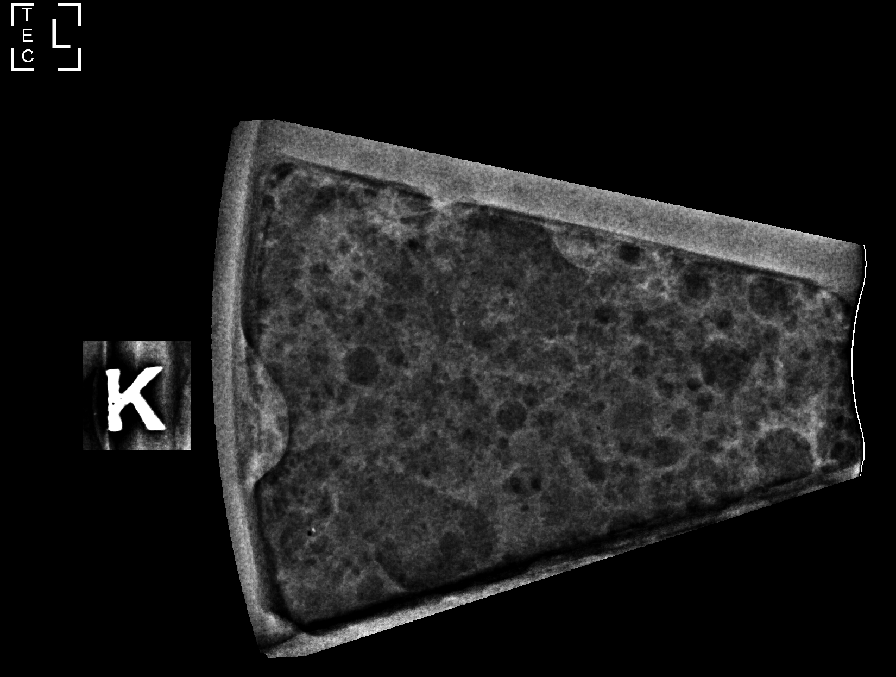

[L (3 of 8)]
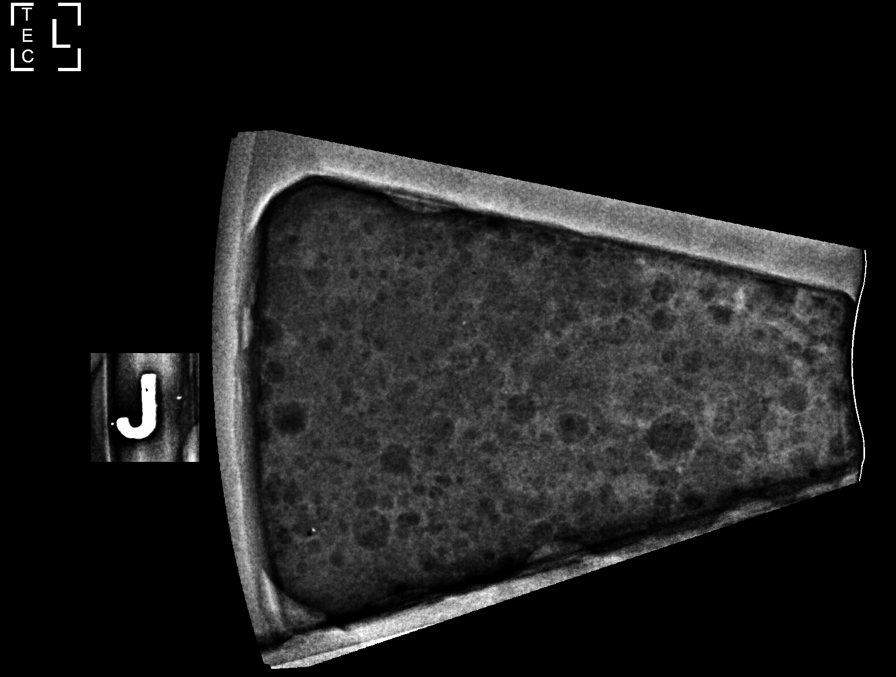

[L (4 of 8)]
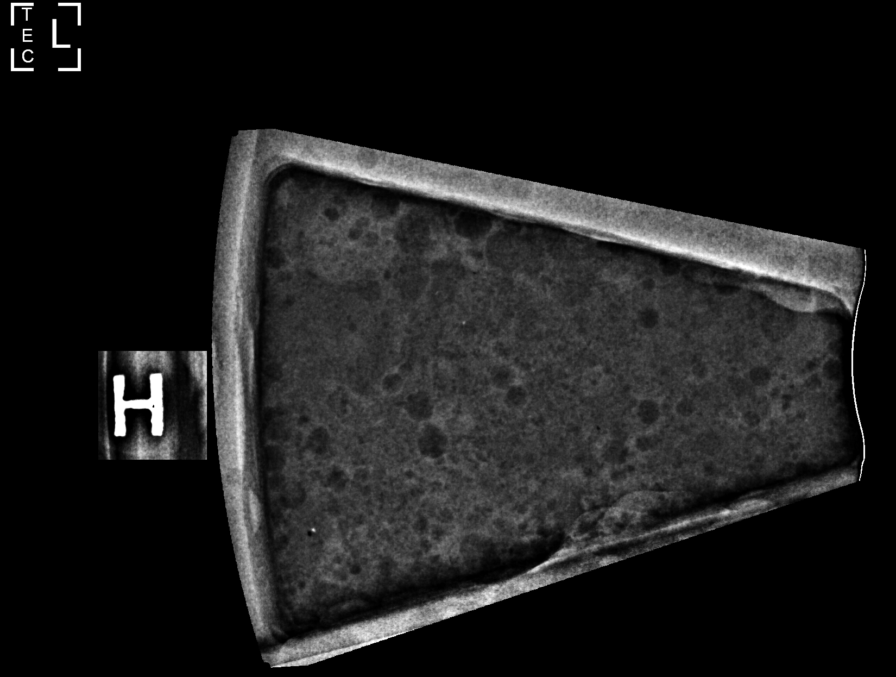

[L (5 of 8)]
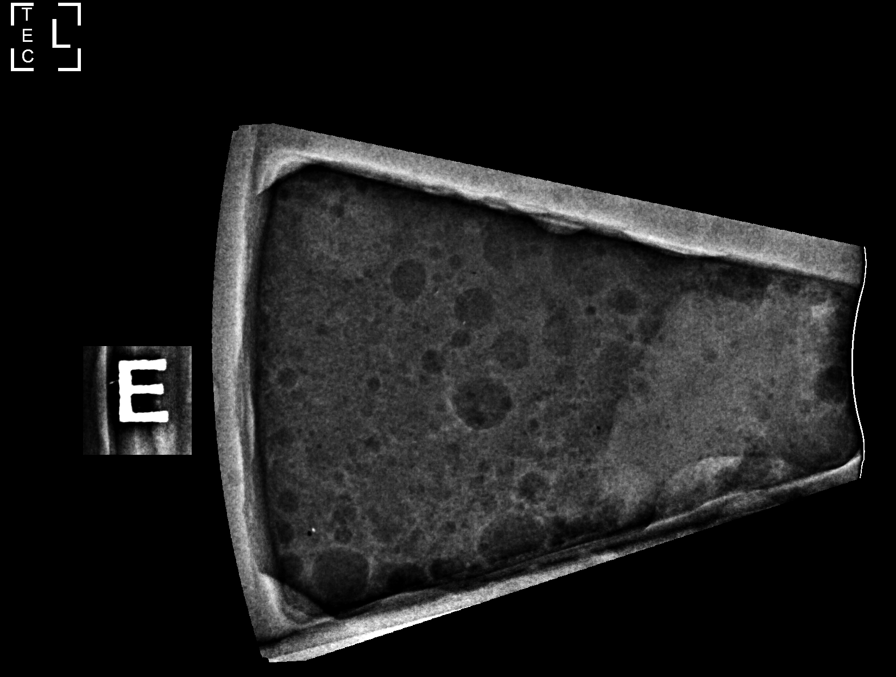

[L (6 of 8)]
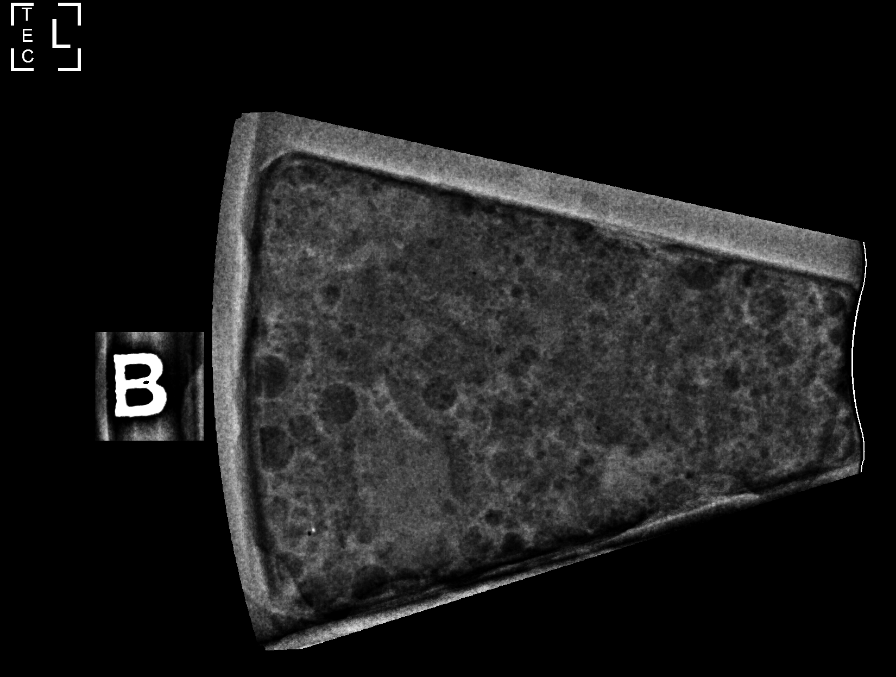

[L (7 of 8)]
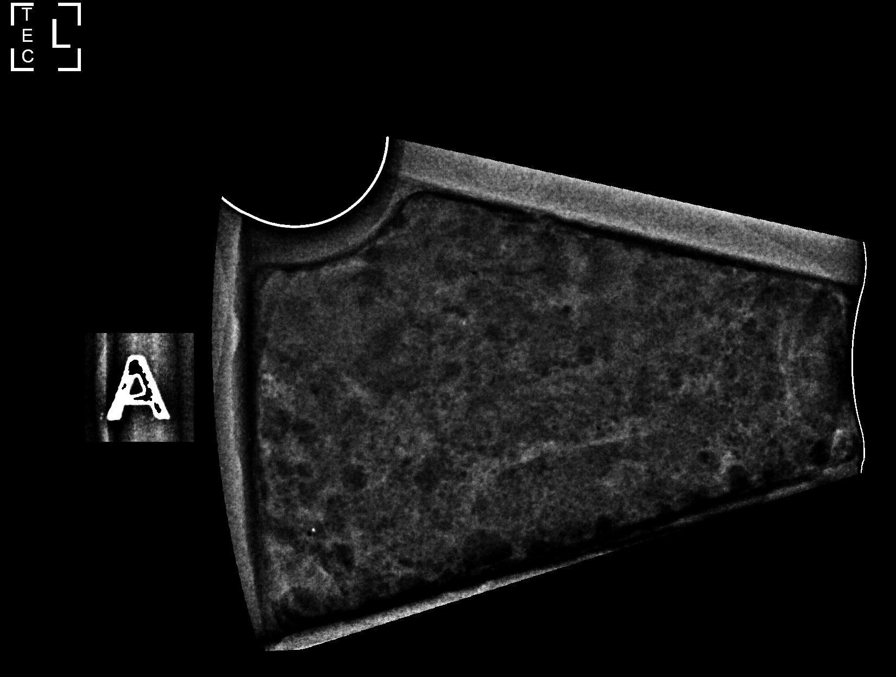

[L (8 of 8)]
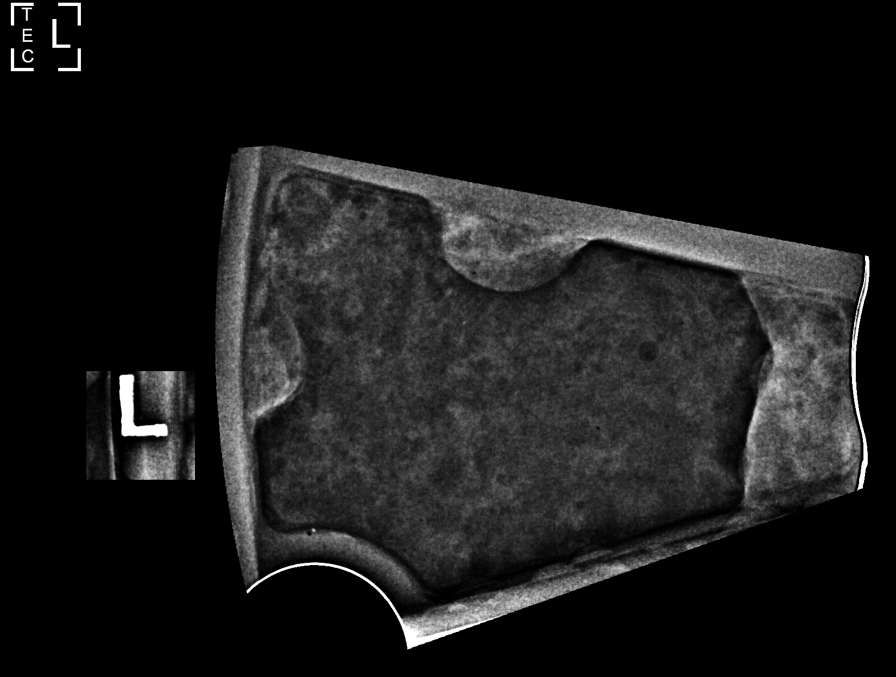

[8 of 38 positions shown; findings below may reference images not displayed]



Using sterile technique and 1% Lidocaine as local anesthetic, under
stereotactic guidance, a 9 gauge vacuum assisted device was used to
perform core needle biopsy of 0.6 cm UPPER INNER LEFT breast mass
using a SUPERIOR approach.

Lesion quadrant: UPPER INNER LEFT breast

At the conclusion of the procedure, a COIL tissue marker clip was
deployed into the biopsy cavity. Follow-up 2-view mammogram was
performed and dictated separately.
IMPRESSION: Stereotactic-guided biopsy of 0.6 cm UPPER INNER LEFT breast mass.
No apparent complications.

ADDENDUM:
Pathology revealed DUCTAL ADENOMA of the LEFT breast, upper inner.
This was found to be concordant by Dr. Orkic Janjic.

Pathology results were discussed with the patient by telephone. The
patient reported doing well after the biopsy with tenderness at the
site. Post biopsy instructions and care were reviewed and questions
were answered. The patient was encouraged to call The [REDACTED]

The patient was instructed to return for annual screening
mammography.

Pathology results reported by Adrina Paden, RN on 04/02/2020.



Using sterile technique and 1% Lidocaine as local anesthetic, under
stereotactic guidance, a 9 gauge vacuum assisted device was used to
perform core needle biopsy of 0.6 cm UPPER INNER LEFT breast mass
using a SUPERIOR approach.

Lesion quadrant: UPPER INNER LEFT breast

At the conclusion of the procedure, a COIL tissue marker clip was
deployed into the biopsy cavity. Follow-up 2-view mammogram was
performed and dictated separately.
IMPRESSION: Stereotactic-guided biopsy of 0.6 cm UPPER INNER LEFT breast mass.
No apparent complications.

## 2022-04-13 DIAGNOSIS — I10 Essential (primary) hypertension: Secondary | ICD-10-CM | POA: Diagnosis not present

## 2022-04-13 DIAGNOSIS — Z20822 Contact with and (suspected) exposure to covid-19: Secondary | ICD-10-CM | POA: Diagnosis not present

## 2022-04-13 DIAGNOSIS — U071 COVID-19: Secondary | ICD-10-CM | POA: Diagnosis not present

## 2022-04-13 DIAGNOSIS — H66001 Acute suppurative otitis media without spontaneous rupture of ear drum, right ear: Secondary | ICD-10-CM | POA: Diagnosis not present

## 2022-04-29 NOTE — Progress Notes (Deleted)
NEUROLOGY FOLLOW UP OFFICE NOTE  [name] [id number]  Assessment/Plan:   1.  Migraine with aura, without status migrainosus, not intractable 2.  Paresthesias - unclear etiology.   Migraine prevention:  Will restart Emgality Migraine rescue:  Nurtec Limit use of pain relievers to no more than 2 days out of week to prevent risk of rebound or medication-overuse headache. Keep headache diary Follow up 6 months.    Subjective:  [name] is a 41 year old Caucasian woman with PCOS, depression, anxiety and PTSD who follows up for migraines   UPDATE: Last seen in February 2022. For workup, ordered NCV-EMG.  This had not been done.  Restarted Emgality. ***  She had a NCV-EMG on 02/19/2022 which was normal.     Current NSAIDS:  ibuprofen Current analgesics:  none Current triptans:  none Current ergotamine:  none Current anti-emetic:  none Current muscle relaxants:  Flexeril 44m Current anti-anxiolytic:  Xanax Current sleep aide:  none Current Antihypertensive medications:  Metoprolol succinate  (for tachycardia) Current Antidepressant medications:  Prozac 281mtwice daily Current Anticonvulsant medications:  none Current anti-CGRP: Emgality; Nurtec (rescue) Current Vitamins/Herbal/Supplements:  D3 Current Antihistamines/Decongestants:  none Other therapy:  none Hormone/birth control:  none   Caffeine:  No coffee.  Occasional Coke Alcohol:  rarely Smoker:  2-3 cigarettes daily Diet:  16 oz water daily.  Skips meals.   Exercise:  yes Depression:  improving; Anxiety:  Improving.  Sees a therapist. Other pain:  Neck pain.  Sometimes associated with pain and numbness into the fingers involving either arm) Sleep hygiene:  Some trouble falling asleep   HISTORY:  Onset:  1996 Location:  Left sided from front to back and down the side of neck. Quality:  Sometimes throbbing, sometimes shooting Initial intensity:  8/10.  She denies new headache, thunderclap  headache Aura:  no Premonitory Phase:  lethargy Postdrome:  lethargy Associated symptoms:  Nausea, sometimes vomiting, photophobia, phonophobia, osmophobia, blurred vision/clouding of vision, dizziness.  She denies associated unilateral numbness or weakness. Initial duration:  1 to 3 or 4 days Initial frequency:  Once a week (5 to 10 days a month on average) Initial frequency of abortive medication: maybe once a week Triggers:  Change in weather, emotional stress Relieving factors:  Ice packs Activity:  Aggravates   She was diagnosed with idiopathic intracranial hypertension in 2011.  She presented with severe headaches.  Eye exam revealed papilledema.  She did have prior brain MRI over a decade ago.  She was previously on Diamox and Lasix.  Last LP was in 2012 while pregnant.  No symptoms since then.  Denies visual disturbance other than with headache.  She saw Dr. ShManuella Ghazif ophthalmology in May 2022 whose exam did not demonstrate papilledema or VF deficits.  She had contracted COVID in early January 2022. She started having dizziness and balance problems.  She had confusion and trouble remembering things such as her phone number.  She started having new headaches, an intermittent sharp shooting pain in the right parietal region and behind right ear with persistent head pressure and pressure in right ear. Ear would pop.  Has seen her ENT about this.  Endorses generalized pain.  She has had labile hypertension and tachycardia.  She reported blood pressures as high as 188/101 and HR 105-110.  Metoprolol was increased by her PCP yesterday.  She went to the ED on 07/15/2020 where CT of head was performed, which was personally reviewed and was negative.  For 10 years, she reports intermittent numbness and tingling various parts of her body and burning on back of neck, shoulders and sometimes down the arms or down the legs.  They often occur a couple of times a week and may last up to 3 hours.  She reports  that she has decreased sensation worse on her left side when compared to her right sided.  She saw a spine specialist.  MRI of cervical, thoracic and lumbar spine on 10/20/2019 personally reviewed was unremarkable with minimal spurring at C4-5 and mild lumbar facet hypertrophy but nothing significant that would explain her symptoms.  MRI of brain with and without contrast on 01/22/2020 was normal.  TSH from 03/11/2020 was 2.26.  B12 from November was 256.  Advised to start B12 1053mg daily.  No improvement in paresthesias since starting B12.    Past NSAIDS:  Aleve Past analgesics:  Tylenol, Excedrin, possibly Fioricet Past abortive triptans:  Sumatriptan [place] (chest tightness/difficulty breathing), Maxalt (ineffective) Past abortive ergotamine:  no Past muscle relaxants:  tizanidine Past anti-emetic:  promethazine Past antihypertensive medications:  no Past antidepressant medications:  Sertraline, maybe amitriptyline, Celexa, Effexor Past anticonvulsant medications:  topiramate (kidney issues) Past anti-CGRP:  Aimovig (ineffective), Ubrelvy 1083mPast vitamins/Herbal/Supplements:  no Past antihistamines/decongestants:  no Other past therapies:  no     Family history of headache:  Second cousin (IIH, "brain tumor"); great grandfather both had IIH  PAST MEDICAL HISTORY: Past Medical History:  Diagnosis Date   Anxiety    celexa daily   Arthritis    hands/wrist - no meds   Chronic GERD    Depression    celexa daily   Dysrhythmia    hx irregular heart beats - no current prob, stress related.   Eczema    GERD (gastroesophageal reflux disease)    tums prn   Headache(784.0)    OTC prn   History of seasonal allergies    benadryl prn   Hypertension    IBS (irritable bowel syndrome)    IIH (idiopathic intracranial hypertension)    on lasix prior to pregnancy   Major depressive disorder    Migraine    Neck pain    PONV (postoperative nausea and vomiting)    PP care - s/p 1C/S 1/19  06/20/2011   Pseudotumor cerebri    PTSD (post-traumatic stress disorder)    Sinusitis    Strep throat    Tachycardia, unspecified     MEDICATIONS: Current Outpatient Medications on File Prior to Visit  Medication Sig Dispense Refill   albuterol (VENTOLIN HFA) 108 (90 Base) MCG/ACT inhaler Inhale into the lungs.     ALPRAZolam (XANAX) 0.5 MG tablet Take 0.5 mg by mouth 3 (three) times daily as needed for anxiety.     Cholecalciferol (VITAMIN D3 PO) Take 2,000 Int'l Units by mouth daily.     citalopram (CELEXA) 20 MG tablet Take 20 mg by mouth daily.     Crisaborole (EUCRISA) 2 % OINT Eucrisa 2 % topical ointment     Cyanocobalamin 1000 MCG/ML KIT Please inject 100059mdaily x 7 days, then 1000m95m one month then monthly after that 1 kit 11   cyclobenzaprine (FLEXERIL) 10 MG tablet TAKE 1 TABLET(10 MG) BY MOUTH AT BEDTIME AS NEEDED FOR MUSCLE SPASMS 30 tablet 3   FLUoxetine (PROZAC) 20 MG tablet Take 60 mg by mouth daily.     fluticasone (FLONASE) 50 MCG/ACT nasal spray Place 2 sprays into the nose daily. (Patient not taking: Reported on  03/28/2021) 16 g 0   Galcanezumab-gnlm (EMGALITY) 120 MG/ML SOAJ Inject 120 mg into the skin every 28 (twenty-eight) days. MAINTENANCE DOSE 1.12 mL 5   ibuprofen (ADVIL) 200 MG tablet Take 800 mg by mouth every 6 (six) hours as needed for mild pain.     Metoprolol Succinate 25 MG CS24 Take 12.5 mg by mouth in the morning and at bedtime. Half tab in am, half tab in pm     pantoprazole (PROTONIX) 40 MG tablet Take 40 mg by mouth daily.     Pseudoephedrine-Guaifenesin (MUCINEX D) [phone] MG TB12 Take 1 tablet by mouth 2 (two) times daily. (Patient not taking: Reported on 03/28/2021) 20 each 0   Rimegepant Sulfate (NURTEC) 75 MG TBDP Take 75 mg by mouth daily as needed. 16 tablet 5   SKYRIZI PEN 150 MG/ML SOAJ Inject into the skin. Q 3 months     tiZANidine (ZANAFLEX) 2 MG tablet TAKE 1 TABLET(2 MG) BY MOUTH AT BEDTIME AS NEEDED FOR MUSCLE SPASMS 30 tablet 3    traZODone (DESYREL) 50 MG tablet Take 12.5 mg by mouth at bedtime as needed for sleep.     [DISCONTINUED] diphenhydrAMINE (BENADRYL) 25 mg capsule Take 25 mg by mouth every 6 (six) hours as needed. For allergies      No current facility-administered medications on file prior to visit.    ALLERGIES: Allergies  Allergen Reactions   Azithromycin Nausea And Vomiting   Other Nausea And Vomiting    MRI contrast   Topamax [Topiramate] Other (See Comments)    Kidney stones   Vicodin [Hydrocodone-Acetaminophen] Itching    FAMILY HISTORY: Family History  Problem Relation Age of Onset   Diabetes Mother    Heart disease Mother    Asthma Mother    Cancer Mother    Heart attack Father    Diabetes Father    Hyperlipidemia Sister    Diabetes Maternal Grandmother    Diabetes Maternal Grandfather    Migraines Son    Healthy Son    Healthy Daughter       Objective:  *** General: No acute distress.  Patient appears well-groomed.   Head:  Normocephalic/atraumatic Eyes:  Fundi examined but not visualized Neck: supple, no paraspinal tenderness, full range of motion Heart:  Regular rate and rhythm Neurological Exam: ***   Metta Clines, DO  CC: Alma Friendly, MD

## 2022-05-01 ENCOUNTER — Ambulatory Visit: Payer: 59 | Admitting: Neurology

## 2022-05-15 DIAGNOSIS — F330 Major depressive disorder, recurrent, mild: Secondary | ICD-10-CM | POA: Diagnosis not present

## 2022-05-15 DIAGNOSIS — F411 Generalized anxiety disorder: Secondary | ICD-10-CM | POA: Diagnosis not present

## 2022-05-15 DIAGNOSIS — F4310 Post-traumatic stress disorder, unspecified: Secondary | ICD-10-CM | POA: Diagnosis not present

## 2022-05-26 DIAGNOSIS — I10 Essential (primary) hypertension: Secondary | ICD-10-CM | POA: Diagnosis not present

## 2022-05-26 DIAGNOSIS — J0140 Acute pansinusitis, unspecified: Secondary | ICD-10-CM | POA: Diagnosis not present

## 2022-07-10 DIAGNOSIS — Z1231 Encounter for screening mammogram for malignant neoplasm of breast: Secondary | ICD-10-CM | POA: Diagnosis not present

## 2022-07-10 LAB — HM MAMMOGRAPHY

## 2022-07-27 ENCOUNTER — Telehealth: Payer: Self-pay

## 2022-07-27 NOTE — Telephone Encounter (Signed)
[facility] called and they are needing the most recent notes faxed to them, fax number is [phone].

## 2022-07-28 NOTE — Telephone Encounter (Signed)
Office notes faxed to number provided.

## 2022-07-31 DIAGNOSIS — S60111A Contusion of right thumb with damage to nail, initial encounter: Secondary | ICD-10-CM | POA: Diagnosis not present

## 2022-07-31 DIAGNOSIS — S6701XA Crushing injury of right thumb, initial encounter: Secondary | ICD-10-CM | POA: Diagnosis not present

## 2022-08-04 DIAGNOSIS — G43B0 Ophthalmoplegic migraine, not intractable: Secondary | ICD-10-CM | POA: Diagnosis not present

## 2022-08-04 DIAGNOSIS — G932 Benign intracranial hypertension: Secondary | ICD-10-CM | POA: Diagnosis not present

## 2022-08-04 DIAGNOSIS — H524 Presbyopia: Secondary | ICD-10-CM | POA: Diagnosis not present

## 2022-08-04 DIAGNOSIS — M79644 Pain in right finger(s): Secondary | ICD-10-CM | POA: Diagnosis not present

## 2022-09-18 DIAGNOSIS — D069 Carcinoma in situ of cervix, unspecified: Secondary | ICD-10-CM | POA: Diagnosis not present

## 2022-09-18 DIAGNOSIS — R87811 Vaginal high risk human papillomavirus (HPV) DNA test positive: Secondary | ICD-10-CM | POA: Diagnosis not present

## 2022-09-18 DIAGNOSIS — Z01419 Encounter for gynecological examination (general) (routine) without abnormal findings: Secondary | ICD-10-CM | POA: Diagnosis not present

## 2022-10-01 DIAGNOSIS — R87811 Vaginal high risk human papillomavirus (HPV) DNA test positive: Secondary | ICD-10-CM | POA: Insufficient documentation

## 2022-10-01 DIAGNOSIS — G932 Benign intracranial hypertension: Secondary | ICD-10-CM | POA: Diagnosis not present

## 2022-10-01 DIAGNOSIS — Z1330 Encounter for screening examination for mental health and behavioral disorders, unspecified: Secondary | ICD-10-CM | POA: Diagnosis not present

## 2022-10-01 DIAGNOSIS — G43009 Migraine without aura, not intractable, without status migrainosus: Secondary | ICD-10-CM | POA: Diagnosis not present

## 2022-10-01 DIAGNOSIS — R87622 Low grade squamous intraepithelial lesion on cytologic smear of vagina (LGSIL): Secondary | ICD-10-CM | POA: Insufficient documentation

## 2022-10-01 DIAGNOSIS — L409 Psoriasis, unspecified: Secondary | ICD-10-CM | POA: Diagnosis not present

## 2022-10-06 DIAGNOSIS — Z79899 Other long term (current) drug therapy: Secondary | ICD-10-CM | POA: Insufficient documentation

## 2022-10-08 DIAGNOSIS — N900 Mild vulvar dysplasia: Secondary | ICD-10-CM | POA: Diagnosis not present

## 2022-10-09 DIAGNOSIS — R1310 Dysphagia, unspecified: Secondary | ICD-10-CM | POA: Diagnosis not present

## 2022-10-09 DIAGNOSIS — R140 Abdominal distension (gaseous): Secondary | ICD-10-CM | POA: Diagnosis not present

## 2022-10-09 DIAGNOSIS — K219 Gastro-esophageal reflux disease without esophagitis: Secondary | ICD-10-CM | POA: Diagnosis not present

## 2022-10-09 DIAGNOSIS — R110 Nausea: Secondary | ICD-10-CM | POA: Diagnosis not present

## 2022-11-06 DIAGNOSIS — F411 Generalized anxiety disorder: Secondary | ICD-10-CM | POA: Diagnosis not present

## 2022-11-06 DIAGNOSIS — F5101 Primary insomnia: Secondary | ICD-10-CM | POA: Diagnosis not present

## 2022-11-06 DIAGNOSIS — F4310 Post-traumatic stress disorder, unspecified: Secondary | ICD-10-CM | POA: Diagnosis not present

## 2022-11-06 DIAGNOSIS — F330 Major depressive disorder, recurrent, mild: Secondary | ICD-10-CM | POA: Diagnosis not present

## 2022-12-07 DIAGNOSIS — F332 Major depressive disorder, recurrent severe without psychotic features: Secondary | ICD-10-CM | POA: Diagnosis not present

## 2022-12-07 DIAGNOSIS — F411 Generalized anxiety disorder: Secondary | ICD-10-CM | POA: Diagnosis not present

## 2022-12-07 DIAGNOSIS — F4312 Post-traumatic stress disorder, chronic: Secondary | ICD-10-CM | POA: Diagnosis not present

## 2022-12-10 DIAGNOSIS — J029 Acute pharyngitis, unspecified: Secondary | ICD-10-CM | POA: Diagnosis not present

## 2022-12-10 DIAGNOSIS — J069 Acute upper respiratory infection, unspecified: Secondary | ICD-10-CM | POA: Diagnosis not present

## 2022-12-10 DIAGNOSIS — R051 Acute cough: Secondary | ICD-10-CM | POA: Diagnosis not present

## 2022-12-13 DIAGNOSIS — K219 Gastro-esophageal reflux disease without esophagitis: Secondary | ICD-10-CM | POA: Diagnosis not present

## 2022-12-13 DIAGNOSIS — Z87891 Personal history of nicotine dependence: Secondary | ICD-10-CM | POA: Diagnosis not present

## 2022-12-13 DIAGNOSIS — Z9049 Acquired absence of other specified parts of digestive tract: Secondary | ICD-10-CM | POA: Diagnosis not present

## 2022-12-13 DIAGNOSIS — F32A Depression, unspecified: Secondary | ICD-10-CM | POA: Diagnosis not present

## 2022-12-13 DIAGNOSIS — E785 Hyperlipidemia, unspecified: Secondary | ICD-10-CM | POA: Diagnosis not present

## 2022-12-13 DIAGNOSIS — R1013 Epigastric pain: Secondary | ICD-10-CM | POA: Diagnosis not present

## 2022-12-13 DIAGNOSIS — R6884 Jaw pain: Secondary | ICD-10-CM | POA: Diagnosis not present

## 2022-12-13 DIAGNOSIS — Z7985 Long-term (current) use of injectable non-insulin antidiabetic drugs: Secondary | ICD-10-CM | POA: Diagnosis not present

## 2022-12-13 DIAGNOSIS — R590 Localized enlarged lymph nodes: Secondary | ICD-10-CM | POA: Diagnosis not present

## 2022-12-13 DIAGNOSIS — Z79899 Other long term (current) drug therapy: Secondary | ICD-10-CM | POA: Diagnosis not present

## 2022-12-13 DIAGNOSIS — R519 Headache, unspecified: Secondary | ICD-10-CM | POA: Diagnosis not present

## 2022-12-13 DIAGNOSIS — R109 Unspecified abdominal pain: Secondary | ICD-10-CM | POA: Diagnosis not present

## 2022-12-13 DIAGNOSIS — M1990 Unspecified osteoarthritis, unspecified site: Secondary | ICD-10-CM | POA: Diagnosis not present

## 2022-12-13 DIAGNOSIS — R0789 Other chest pain: Secondary | ICD-10-CM | POA: Diagnosis not present

## 2022-12-13 DIAGNOSIS — Z886 Allergy status to analgesic agent status: Secondary | ICD-10-CM | POA: Diagnosis not present

## 2022-12-13 DIAGNOSIS — Z883 Allergy status to other anti-infective agents status: Secondary | ICD-10-CM | POA: Diagnosis not present

## 2022-12-13 DIAGNOSIS — R918 Other nonspecific abnormal finding of lung field: Secondary | ICD-10-CM | POA: Diagnosis not present

## 2022-12-13 DIAGNOSIS — J90 Pleural effusion, not elsewhere classified: Secondary | ICD-10-CM | POA: Diagnosis not present

## 2022-12-13 DIAGNOSIS — I10 Essential (primary) hypertension: Secondary | ICD-10-CM | POA: Diagnosis not present

## 2022-12-13 DIAGNOSIS — J189 Pneumonia, unspecified organism: Secondary | ICD-10-CM | POA: Diagnosis not present

## 2022-12-13 DIAGNOSIS — K449 Diaphragmatic hernia without obstruction or gangrene: Secondary | ICD-10-CM | POA: Diagnosis not present

## 2022-12-13 DIAGNOSIS — Z885 Allergy status to narcotic agent status: Secondary | ICD-10-CM | POA: Diagnosis not present

## 2022-12-13 DIAGNOSIS — J168 Pneumonia due to other specified infectious organisms: Secondary | ICD-10-CM | POA: Diagnosis not present

## 2022-12-13 DIAGNOSIS — R0602 Shortness of breath: Secondary | ICD-10-CM | POA: Diagnosis not present

## 2022-12-16 DIAGNOSIS — J189 Pneumonia, unspecified organism: Secondary | ICD-10-CM | POA: Diagnosis not present

## 2022-12-16 DIAGNOSIS — H8113 Benign paroxysmal vertigo, bilateral: Secondary | ICD-10-CM | POA: Diagnosis not present

## 2022-12-16 DIAGNOSIS — Z09 Encounter for follow-up examination after completed treatment for conditions other than malignant neoplasm: Secondary | ICD-10-CM | POA: Diagnosis not present

## 2022-12-23 DIAGNOSIS — R591 Generalized enlarged lymph nodes: Secondary | ICD-10-CM | POA: Diagnosis not present

## 2022-12-23 DIAGNOSIS — J189 Pneumonia, unspecified organism: Secondary | ICD-10-CM | POA: Diagnosis not present

## 2023-01-04 DIAGNOSIS — F332 Major depressive disorder, recurrent severe without psychotic features: Secondary | ICD-10-CM | POA: Diagnosis not present

## 2023-01-04 DIAGNOSIS — F411 Generalized anxiety disorder: Secondary | ICD-10-CM | POA: Diagnosis not present

## 2023-01-04 DIAGNOSIS — F4312 Post-traumatic stress disorder, chronic: Secondary | ICD-10-CM | POA: Diagnosis not present

## 2023-01-15 DIAGNOSIS — E041 Nontoxic single thyroid nodule: Secondary | ICD-10-CM | POA: Diagnosis not present

## 2023-01-15 DIAGNOSIS — R918 Other nonspecific abnormal finding of lung field: Secondary | ICD-10-CM | POA: Diagnosis not present

## 2023-01-15 DIAGNOSIS — J189 Pneumonia, unspecified organism: Secondary | ICD-10-CM | POA: Diagnosis not present

## 2023-01-21 DIAGNOSIS — J45909 Unspecified asthma, uncomplicated: Secondary | ICD-10-CM | POA: Diagnosis not present

## 2023-02-03 DIAGNOSIS — F411 Generalized anxiety disorder: Secondary | ICD-10-CM | POA: Diagnosis not present

## 2023-02-03 DIAGNOSIS — F4312 Post-traumatic stress disorder, chronic: Secondary | ICD-10-CM | POA: Diagnosis not present

## 2023-02-03 DIAGNOSIS — F332 Major depressive disorder, recurrent severe without psychotic features: Secondary | ICD-10-CM | POA: Diagnosis not present

## 2023-02-17 DIAGNOSIS — F4312 Post-traumatic stress disorder, chronic: Secondary | ICD-10-CM | POA: Diagnosis not present

## 2023-02-17 DIAGNOSIS — F332 Major depressive disorder, recurrent severe without psychotic features: Secondary | ICD-10-CM | POA: Diagnosis not present

## 2023-02-17 DIAGNOSIS — F411 Generalized anxiety disorder: Secondary | ICD-10-CM | POA: Diagnosis not present

## 2023-02-19 DIAGNOSIS — I10 Essential (primary) hypertension: Secondary | ICD-10-CM | POA: Diagnosis not present

## 2023-02-19 DIAGNOSIS — R002 Palpitations: Secondary | ICD-10-CM | POA: Diagnosis not present

## 2023-03-08 DIAGNOSIS — E041 Nontoxic single thyroid nodule: Secondary | ICD-10-CM | POA: Diagnosis not present

## 2023-03-08 DIAGNOSIS — E042 Nontoxic multinodular goiter: Secondary | ICD-10-CM | POA: Diagnosis not present

## 2023-03-17 DIAGNOSIS — F4312 Post-traumatic stress disorder, chronic: Secondary | ICD-10-CM | POA: Diagnosis not present

## 2023-03-17 DIAGNOSIS — F411 Generalized anxiety disorder: Secondary | ICD-10-CM | POA: Diagnosis not present

## 2023-03-17 DIAGNOSIS — F332 Major depressive disorder, recurrent severe without psychotic features: Secondary | ICD-10-CM | POA: Diagnosis not present

## 2023-03-31 DIAGNOSIS — F4312 Post-traumatic stress disorder, chronic: Secondary | ICD-10-CM | POA: Diagnosis not present

## 2023-03-31 DIAGNOSIS — F411 Generalized anxiety disorder: Secondary | ICD-10-CM | POA: Diagnosis not present

## 2023-03-31 DIAGNOSIS — F332 Major depressive disorder, recurrent severe without psychotic features: Secondary | ICD-10-CM | POA: Diagnosis not present

## 2023-04-12 DIAGNOSIS — J069 Acute upper respiratory infection, unspecified: Secondary | ICD-10-CM | POA: Diagnosis not present

## 2023-04-12 DIAGNOSIS — H9203 Otalgia, bilateral: Secondary | ICD-10-CM | POA: Diagnosis not present

## 2023-04-12 DIAGNOSIS — J029 Acute pharyngitis, unspecified: Secondary | ICD-10-CM | POA: Diagnosis not present

## 2023-04-14 DIAGNOSIS — F4312 Post-traumatic stress disorder, chronic: Secondary | ICD-10-CM | POA: Diagnosis not present

## 2023-04-14 DIAGNOSIS — F411 Generalized anxiety disorder: Secondary | ICD-10-CM | POA: Diagnosis not present

## 2023-04-14 DIAGNOSIS — F332 Major depressive disorder, recurrent severe without psychotic features: Secondary | ICD-10-CM | POA: Diagnosis not present

## 2023-04-26 DIAGNOSIS — Z2839 Other underimmunization status: Secondary | ICD-10-CM | POA: Diagnosis not present

## 2023-04-26 DIAGNOSIS — Z23 Encounter for immunization: Secondary | ICD-10-CM | POA: Diagnosis not present

## 2023-04-26 DIAGNOSIS — R0600 Dyspnea, unspecified: Secondary | ICD-10-CM | POA: Diagnosis not present

## 2023-04-28 DIAGNOSIS — F411 Generalized anxiety disorder: Secondary | ICD-10-CM | POA: Diagnosis not present

## 2023-04-28 DIAGNOSIS — F4312 Post-traumatic stress disorder, chronic: Secondary | ICD-10-CM | POA: Diagnosis not present

## 2023-04-28 DIAGNOSIS — F332 Major depressive disorder, recurrent severe without psychotic features: Secondary | ICD-10-CM | POA: Diagnosis not present

## 2023-05-07 DIAGNOSIS — F5101 Primary insomnia: Secondary | ICD-10-CM | POA: Diagnosis not present

## 2023-05-07 DIAGNOSIS — F330 Major depressive disorder, recurrent, mild: Secondary | ICD-10-CM | POA: Diagnosis not present

## 2023-05-07 DIAGNOSIS — F4310 Post-traumatic stress disorder, unspecified: Secondary | ICD-10-CM | POA: Diagnosis not present

## 2023-05-07 DIAGNOSIS — F411 Generalized anxiety disorder: Secondary | ICD-10-CM | POA: Diagnosis not present

## 2023-05-12 DIAGNOSIS — F332 Major depressive disorder, recurrent severe without psychotic features: Secondary | ICD-10-CM | POA: Diagnosis not present

## 2023-05-12 DIAGNOSIS — F411 Generalized anxiety disorder: Secondary | ICD-10-CM | POA: Diagnosis not present

## 2023-05-12 DIAGNOSIS — F4312 Post-traumatic stress disorder, chronic: Secondary | ICD-10-CM | POA: Diagnosis not present

## 2023-06-09 DIAGNOSIS — F411 Generalized anxiety disorder: Secondary | ICD-10-CM | POA: Diagnosis not present

## 2023-06-09 DIAGNOSIS — F4312 Post-traumatic stress disorder, chronic: Secondary | ICD-10-CM | POA: Diagnosis not present

## 2023-06-09 DIAGNOSIS — F332 Major depressive disorder, recurrent severe without psychotic features: Secondary | ICD-10-CM | POA: Diagnosis not present

## 2023-07-07 DIAGNOSIS — F4312 Post-traumatic stress disorder, chronic: Secondary | ICD-10-CM | POA: Diagnosis not present

## 2023-07-07 DIAGNOSIS — F332 Major depressive disorder, recurrent severe without psychotic features: Secondary | ICD-10-CM | POA: Diagnosis not present

## 2023-07-07 DIAGNOSIS — F411 Generalized anxiety disorder: Secondary | ICD-10-CM | POA: Diagnosis not present

## 2023-07-20 DIAGNOSIS — F411 Generalized anxiety disorder: Secondary | ICD-10-CM | POA: Diagnosis not present

## 2023-07-20 DIAGNOSIS — F4312 Post-traumatic stress disorder, chronic: Secondary | ICD-10-CM | POA: Diagnosis not present

## 2023-07-20 DIAGNOSIS — F332 Major depressive disorder, recurrent severe without psychotic features: Secondary | ICD-10-CM | POA: Diagnosis not present

## 2023-07-27 DIAGNOSIS — F411 Generalized anxiety disorder: Secondary | ICD-10-CM | POA: Diagnosis not present

## 2023-07-27 DIAGNOSIS — F332 Major depressive disorder, recurrent severe without psychotic features: Secondary | ICD-10-CM | POA: Diagnosis not present

## 2023-07-27 DIAGNOSIS — F4312 Post-traumatic stress disorder, chronic: Secondary | ICD-10-CM | POA: Diagnosis not present

## 2023-08-03 DIAGNOSIS — F4312 Post-traumatic stress disorder, chronic: Secondary | ICD-10-CM | POA: Diagnosis not present

## 2023-08-03 DIAGNOSIS — F332 Major depressive disorder, recurrent severe without psychotic features: Secondary | ICD-10-CM | POA: Diagnosis not present

## 2023-08-03 DIAGNOSIS — F411 Generalized anxiety disorder: Secondary | ICD-10-CM | POA: Diagnosis not present

## 2023-08-05 DIAGNOSIS — G932 Benign intracranial hypertension: Secondary | ICD-10-CM | POA: Diagnosis not present

## 2023-08-05 DIAGNOSIS — G43B0 Ophthalmoplegic migraine, not intractable: Secondary | ICD-10-CM | POA: Diagnosis not present

## 2023-08-05 DIAGNOSIS — H04123 Dry eye syndrome of bilateral lacrimal glands: Secondary | ICD-10-CM | POA: Diagnosis not present

## 2023-08-05 DIAGNOSIS — H524 Presbyopia: Secondary | ICD-10-CM | POA: Diagnosis not present

## 2023-08-17 DIAGNOSIS — F411 Generalized anxiety disorder: Secondary | ICD-10-CM | POA: Diagnosis not present

## 2023-08-17 DIAGNOSIS — F332 Major depressive disorder, recurrent severe without psychotic features: Secondary | ICD-10-CM | POA: Diagnosis not present

## 2023-08-17 DIAGNOSIS — F4312 Post-traumatic stress disorder, chronic: Secondary | ICD-10-CM | POA: Diagnosis not present

## 2023-08-24 DIAGNOSIS — F4312 Post-traumatic stress disorder, chronic: Secondary | ICD-10-CM | POA: Diagnosis not present

## 2023-08-24 DIAGNOSIS — F332 Major depressive disorder, recurrent severe without psychotic features: Secondary | ICD-10-CM | POA: Diagnosis not present

## 2023-08-24 DIAGNOSIS — F411 Generalized anxiety disorder: Secondary | ICD-10-CM | POA: Diagnosis not present

## 2023-08-31 DIAGNOSIS — F411 Generalized anxiety disorder: Secondary | ICD-10-CM | POA: Diagnosis not present

## 2023-08-31 DIAGNOSIS — F4312 Post-traumatic stress disorder, chronic: Secondary | ICD-10-CM | POA: Diagnosis not present

## 2023-08-31 DIAGNOSIS — F332 Major depressive disorder, recurrent severe without psychotic features: Secondary | ICD-10-CM | POA: Diagnosis not present

## 2023-09-07 DIAGNOSIS — F411 Generalized anxiety disorder: Secondary | ICD-10-CM | POA: Diagnosis not present

## 2023-09-07 DIAGNOSIS — F4312 Post-traumatic stress disorder, chronic: Secondary | ICD-10-CM | POA: Diagnosis not present

## 2023-09-07 DIAGNOSIS — F332 Major depressive disorder, recurrent severe without psychotic features: Secondary | ICD-10-CM | POA: Diagnosis not present

## 2023-09-10 DIAGNOSIS — F330 Major depressive disorder, recurrent, mild: Secondary | ICD-10-CM | POA: Diagnosis not present

## 2023-09-10 DIAGNOSIS — F4310 Post-traumatic stress disorder, unspecified: Secondary | ICD-10-CM | POA: Diagnosis not present

## 2023-09-10 DIAGNOSIS — F411 Generalized anxiety disorder: Secondary | ICD-10-CM | POA: Diagnosis not present

## 2023-09-10 DIAGNOSIS — F5101 Primary insomnia: Secondary | ICD-10-CM | POA: Diagnosis not present

## 2023-09-14 DIAGNOSIS — F4312 Post-traumatic stress disorder, chronic: Secondary | ICD-10-CM | POA: Diagnosis not present

## 2023-09-14 DIAGNOSIS — F411 Generalized anxiety disorder: Secondary | ICD-10-CM | POA: Diagnosis not present

## 2023-09-14 DIAGNOSIS — F332 Major depressive disorder, recurrent severe without psychotic features: Secondary | ICD-10-CM | POA: Diagnosis not present

## 2023-09-20 DIAGNOSIS — R252 Cramp and spasm: Secondary | ICD-10-CM | POA: Insufficient documentation

## 2023-09-20 DIAGNOSIS — M62838 Other muscle spasm: Secondary | ICD-10-CM | POA: Diagnosis not present

## 2023-09-21 DIAGNOSIS — Z01419 Encounter for gynecological examination (general) (routine) without abnormal findings: Secondary | ICD-10-CM | POA: Diagnosis not present

## 2023-09-21 DIAGNOSIS — R300 Dysuria: Secondary | ICD-10-CM | POA: Diagnosis not present

## 2023-09-21 DIAGNOSIS — Z1231 Encounter for screening mammogram for malignant neoplasm of breast: Secondary | ICD-10-CM | POA: Diagnosis not present

## 2023-09-21 DIAGNOSIS — F411 Generalized anxiety disorder: Secondary | ICD-10-CM | POA: Diagnosis not present

## 2023-09-21 DIAGNOSIS — Z780 Asymptomatic menopausal state: Secondary | ICD-10-CM | POA: Diagnosis not present

## 2023-09-21 DIAGNOSIS — Z1331 Encounter for screening for depression: Secondary | ICD-10-CM | POA: Diagnosis not present

## 2023-09-21 DIAGNOSIS — F332 Major depressive disorder, recurrent severe without psychotic features: Secondary | ICD-10-CM | POA: Diagnosis not present

## 2023-09-21 DIAGNOSIS — N760 Acute vaginitis: Secondary | ICD-10-CM | POA: Diagnosis not present

## 2023-09-21 DIAGNOSIS — R87622 Low grade squamous intraepithelial lesion on cytologic smear of vagina (LGSIL): Secondary | ICD-10-CM | POA: Diagnosis not present

## 2023-09-21 DIAGNOSIS — F4312 Post-traumatic stress disorder, chronic: Secondary | ICD-10-CM | POA: Diagnosis not present

## 2023-09-24 DIAGNOSIS — R1031 Right lower quadrant pain: Secondary | ICD-10-CM | POA: Diagnosis not present

## 2023-09-24 DIAGNOSIS — K580 Irritable bowel syndrome with diarrhea: Secondary | ICD-10-CM | POA: Diagnosis not present

## 2023-09-24 DIAGNOSIS — K5900 Constipation, unspecified: Secondary | ICD-10-CM | POA: Diagnosis not present

## 2023-09-24 DIAGNOSIS — K219 Gastro-esophageal reflux disease without esophagitis: Secondary | ICD-10-CM | POA: Diagnosis not present

## 2023-09-24 DIAGNOSIS — R09A2 Foreign body sensation, throat: Secondary | ICD-10-CM | POA: Diagnosis not present

## 2023-09-24 DIAGNOSIS — Z9049 Acquired absence of other specified parts of digestive tract: Secondary | ICD-10-CM | POA: Diagnosis not present

## 2023-09-24 DIAGNOSIS — R198 Other specified symptoms and signs involving the digestive system and abdomen: Secondary | ICD-10-CM | POA: Diagnosis not present

## 2023-09-24 DIAGNOSIS — R1032 Left lower quadrant pain: Secondary | ICD-10-CM | POA: Diagnosis not present

## 2023-09-24 DIAGNOSIS — R1310 Dysphagia, unspecified: Secondary | ICD-10-CM | POA: Diagnosis not present

## 2023-09-27 DIAGNOSIS — F4312 Post-traumatic stress disorder, chronic: Secondary | ICD-10-CM | POA: Diagnosis not present

## 2023-09-27 DIAGNOSIS — F332 Major depressive disorder, recurrent severe without psychotic features: Secondary | ICD-10-CM | POA: Diagnosis not present

## 2023-09-27 DIAGNOSIS — F411 Generalized anxiety disorder: Secondary | ICD-10-CM | POA: Diagnosis not present

## 2023-10-05 DIAGNOSIS — F332 Major depressive disorder, recurrent severe without psychotic features: Secondary | ICD-10-CM | POA: Diagnosis not present

## 2023-10-05 DIAGNOSIS — F4312 Post-traumatic stress disorder, chronic: Secondary | ICD-10-CM | POA: Diagnosis not present

## 2023-10-05 DIAGNOSIS — F411 Generalized anxiety disorder: Secondary | ICD-10-CM | POA: Diagnosis not present

## 2023-10-12 DIAGNOSIS — F411 Generalized anxiety disorder: Secondary | ICD-10-CM | POA: Diagnosis not present

## 2023-10-12 DIAGNOSIS — R87611 Atypical squamous cells cannot exclude high grade squamous intraepithelial lesion on cytologic smear of cervix (ASC-H): Secondary | ICD-10-CM | POA: Diagnosis not present

## 2023-10-12 DIAGNOSIS — F332 Major depressive disorder, recurrent severe without psychotic features: Secondary | ICD-10-CM | POA: Diagnosis not present

## 2023-10-12 DIAGNOSIS — F4312 Post-traumatic stress disorder, chronic: Secondary | ICD-10-CM | POA: Diagnosis not present

## 2023-10-14 DIAGNOSIS — F4310 Post-traumatic stress disorder, unspecified: Secondary | ICD-10-CM | POA: Diagnosis not present

## 2023-10-14 DIAGNOSIS — F330 Major depressive disorder, recurrent, mild: Secondary | ICD-10-CM | POA: Diagnosis not present

## 2023-10-14 DIAGNOSIS — F411 Generalized anxiety disorder: Secondary | ICD-10-CM | POA: Diagnosis not present

## 2023-10-14 DIAGNOSIS — F5101 Primary insomnia: Secondary | ICD-10-CM | POA: Diagnosis not present

## 2023-10-19 DIAGNOSIS — F332 Major depressive disorder, recurrent severe without psychotic features: Secondary | ICD-10-CM | POA: Diagnosis not present

## 2023-10-19 DIAGNOSIS — F411 Generalized anxiety disorder: Secondary | ICD-10-CM | POA: Diagnosis not present

## 2023-10-19 DIAGNOSIS — F4312 Post-traumatic stress disorder, chronic: Secondary | ICD-10-CM | POA: Diagnosis not present

## 2023-10-26 DIAGNOSIS — F4312 Post-traumatic stress disorder, chronic: Secondary | ICD-10-CM | POA: Diagnosis not present

## 2023-10-26 DIAGNOSIS — F411 Generalized anxiety disorder: Secondary | ICD-10-CM | POA: Diagnosis not present

## 2023-10-26 DIAGNOSIS — F332 Major depressive disorder, recurrent severe without psychotic features: Secondary | ICD-10-CM | POA: Diagnosis not present

## 2023-11-02 DIAGNOSIS — F332 Major depressive disorder, recurrent severe without psychotic features: Secondary | ICD-10-CM | POA: Diagnosis not present

## 2023-11-02 DIAGNOSIS — F411 Generalized anxiety disorder: Secondary | ICD-10-CM | POA: Diagnosis not present

## 2023-11-02 DIAGNOSIS — F4312 Post-traumatic stress disorder, chronic: Secondary | ICD-10-CM | POA: Diagnosis not present

## 2023-11-09 DIAGNOSIS — F4312 Post-traumatic stress disorder, chronic: Secondary | ICD-10-CM | POA: Diagnosis not present

## 2023-11-09 DIAGNOSIS — F332 Major depressive disorder, recurrent severe without psychotic features: Secondary | ICD-10-CM | POA: Diagnosis not present

## 2023-11-09 DIAGNOSIS — F411 Generalized anxiety disorder: Secondary | ICD-10-CM | POA: Diagnosis not present

## 2023-11-16 DIAGNOSIS — F4312 Post-traumatic stress disorder, chronic: Secondary | ICD-10-CM | POA: Diagnosis not present

## 2023-11-16 DIAGNOSIS — F411 Generalized anxiety disorder: Secondary | ICD-10-CM | POA: Diagnosis not present

## 2023-11-16 DIAGNOSIS — F332 Major depressive disorder, recurrent severe without psychotic features: Secondary | ICD-10-CM | POA: Diagnosis not present

## 2023-11-23 ENCOUNTER — Ambulatory Visit: Admitting: Urgent Care

## 2023-11-23 ENCOUNTER — Encounter: Payer: Self-pay | Admitting: Urgent Care

## 2023-11-23 ENCOUNTER — Other Ambulatory Visit: Payer: Self-pay | Admitting: Urgent Care

## 2023-11-23 ENCOUNTER — Telehealth: Payer: Self-pay

## 2023-11-23 VITALS — BP 127/80 | HR 102 | Ht 66.0 in | Wt 255.0 lb

## 2023-11-23 DIAGNOSIS — E041 Nontoxic single thyroid nodule: Secondary | ICD-10-CM | POA: Diagnosis not present

## 2023-11-23 DIAGNOSIS — Z79899 Other long term (current) drug therapy: Secondary | ICD-10-CM

## 2023-11-23 DIAGNOSIS — L409 Psoriasis, unspecified: Secondary | ICD-10-CM

## 2023-11-23 DIAGNOSIS — F4312 Post-traumatic stress disorder, chronic: Secondary | ICD-10-CM | POA: Diagnosis not present

## 2023-11-23 DIAGNOSIS — F332 Major depressive disorder, recurrent severe without psychotic features: Secondary | ICD-10-CM | POA: Diagnosis not present

## 2023-11-23 DIAGNOSIS — F411 Generalized anxiety disorder: Secondary | ICD-10-CM | POA: Diagnosis not present

## 2023-11-23 DIAGNOSIS — E782 Mixed hyperlipidemia: Secondary | ICD-10-CM

## 2023-11-23 MED ORDER — COSENTYX 75 MG/0.5ML ~~LOC~~ SOSY: 0.5000 mL | PREFILLED_SYRINGE | SUBCUTANEOUS | 0 refills | Status: DC

## 2023-11-23 MED ORDER — COSENTYX (300 MG DOSE) 150 MG/ML ~~LOC~~ SOSY: 300.0000 mg | PREFILLED_SYRINGE | SUBCUTANEOUS | 0 refills | Status: DC

## 2023-11-23 MED ORDER — CLOBETASOL PROPIONATE 0.05 % EX SOLN: 1.0000 | Freq: Two times a day (BID) | CUTANEOUS | 2 refills | Status: AC

## 2023-11-23 MED ORDER — TRIAMCINOLONE ACETONIDE 40 MG/ML IJ SUSP
80.0000 mg | Freq: Once | INTRAMUSCULAR | Status: AC
  Administered 2023-11-23: 80 mg via INTRAMUSCULAR

## 2023-11-23 NOTE — Telephone Encounter (Signed)
 Copied from CRM [id number]. Topic: Clinical - Medication Question >> Nov 23, 2023 12:14 PM Adrianna P wrote: Reason for CRM: erica pharmacist from [facility] called to confirm dosage. [phone]

## 2023-11-23 NOTE — Telephone Encounter (Signed)
 Please notify pharmacy that pt has been on this before and requested a taper up. However, I had discussed with pt using the usual therapy of 300mg . I will correct the script and do the standard dose for her condition, thanks

## 2023-11-23 NOTE — Telephone Encounter (Signed)
 Pharmacy informed.

## 2023-11-23 NOTE — Progress Notes (Signed)
 New Patient Office Visit  Subjective:  Patient ID: [name], female    DOB: 11-Sep-1980  Age: 43 y.o. MRN: [id number]  CC:  Chief Complaint  Patient presents with   New Patient (Initial Visit)    Est. Care    HPI [name] presents to establish care.  Discussed the use of AI scribe software for clinical note transcription with the patient, who gave verbal consent to proceed.  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 Cosentyx effectively controls her symptoms, and Kenalog injections, administered twice a year, help to reduce flare-ups. Psoriasis affects her chest, arms, underarms, and behind her knees, typically improving for two to three months following Kenalog injections. She is not currently using any topical treatments but requests clobetasol solution for her scalp due to itching.  She has been on Strattera for six to eight months, with gradual dose increases. Nortriptyline was recently added to her regimen, and she has increased the dose to 50 mg at bedtime, which helps her sleep. She was previously on Prozac for years but found it ineffective. She attends therapy weekly and reports improvement since discontinuing Prozac.  She is experiencing significant stress due to personal issues, including a recent separation and upcoming legal proceedings. She describes her home life as 'rough' and notes that her children are upset about the situation. She is working on a lifestyle change, including better eating and more exercise, as she plans to move into her own apartment.  She has a history of thyroid nodules, with an ultrasound last year showing three nodules. She reports a low TSH level a year ago and experiences hot flashes and sweating, but she is not on hormone replacement therapy. She plans  to have a repeat ultrasound in October.  Her current medications include albuterol as needed, Strattera 80 mg, alprazolam 0.5 mg at night, cyclobenzaprine  as needed, metoprolol 50 mg twice daily for tachycardia and blood pressure, and Nurtec as needed for migraines. She also uses Sonata occasionally for sleep. She has a history of elevated cholesterol but is not on treatment due to adverse effects from statins.       Outpatient Encounter Medications as of 11/23/2023  Medication Sig   albuterol (VENTOLIN HFA) 108 (90 Base) MCG/ACT inhaler Inhale into the lungs.   ALPRAZolam (XANAX) 0.5 MG tablet Take 0.5 mg by mouth 3 (three) times daily as needed for anxiety.   atomoxetine (STRATTERA) 80 MG capsule Take 80 mg by mouth daily.   clobetasol (TEMOVATE) 0.05 % external solution Apply 1 Application topically 2 (two) times daily.   cyclobenzaprine  (FLEXERIL ) 10 MG tablet TAKE 1 TABLET(10 MG) BY MOUTH AT BEDTIME AS NEEDED FOR MUSCLE SPASMS   ibuprofen  (ADVIL ) 200 MG tablet Take 800 mg by mouth every 6 (six) hours as needed for mild pain.   Metoprolol Succinate 25 MG CS24 Take 12.5 mg by mouth in the morning and at bedtime. Half tab in am, half tab in pm (Patient taking differently: Take 50 mg by mouth in the morning and at bedtime.)   nortriptyline (PAMELOR) 25 MG capsule Take 25 mg by mouth at bedtime.   pantoprazole (PROTONIX) 40 MG tablet Take 40 mg by mouth daily.   Rimegepant Sulfate (NURTEC) 75 MG TBDP Take 75 mg by mouth daily as needed.   zaleplon (SONATA) 10  MG capsule Take 10 mg by mouth at bedtime.   [DISCONTINUED] Secukinumab (COSENTYX) 75 MG/0.5ML SOSY Inject 0.5 mLs (75 mg total) into the skin once a week.   [DISCONTINUED] Cholecalciferol (VITAMIN D3 PO) Take 2,000 Int'l Units by mouth daily.   [DISCONTINUED] citalopram  (CELEXA ) 20 MG tablet Take 20 mg by mouth daily.   [DISCONTINUED] Crisaborole (EUCRISA) 2 % OINT Eucrisa 2 % topical ointment   [DISCONTINUED] Cyanocobalamin  1000 MCG/ML  KIT Please inject 1000mcg daily x 7 days, then 1000mcg x one month then monthly after that   [DISCONTINUED] diphenhydrAMINE  (BENADRYL ) 25 mg capsule Take 25 mg by mouth every 6 (six) hours as needed. For allergies    [DISCONTINUED] FLUoxetine (PROZAC) 20 MG tablet Take 60 mg by mouth daily.   [DISCONTINUED] fluticasone  (FLONASE ) 50 MCG/ACT nasal spray Place 2 sprays into the nose daily. (Patient not taking: Reported on 03/28/2021)   [DISCONTINUED] Galcanezumab -gnlm (EMGALITY ) 120 MG/ML SOAJ Inject 120 mg into the skin every 28 (twenty-eight) days. MAINTENANCE DOSE   [DISCONTINUED] Pseudoephedrine -Guaifenesin  (MUCINEX  D) [id number] MG TB12 Take 1 tablet by mouth 2 (two) times daily. (Patient not taking: Reported on 03/28/2021)   [DISCONTINUED] SKYRIZI PEN 150 MG/ML SOAJ Inject into the skin. Q 3 months   [DISCONTINUED] tiZANidine  (ZANAFLEX ) 2 MG tablet TAKE 1 TABLET(2 MG) BY MOUTH AT BEDTIME AS NEEDED FOR MUSCLE SPASMS   [DISCONTINUED] traZODone (DESYREL) 50 MG tablet Take 12.5 mg by mouth at bedtime as needed for sleep.   [EXPIRED] triamcinolone acetonide (KENALOG-40) injection 80 mg    No facility-administered encounter medications on file as of 11/23/2023.    Past Medical History:  Diagnosis Date   Anxiety    celexa  daily   Arthritis    hands/wrist - no meds   Chronic GERD    Depression    celexa  daily   Dysrhythmia    hx irregular heart beats - no current prob, stress related.   Eczema    GERD (gastroesophageal reflux disease)    tums prn   Headache(784.0)    OTC prn   History of seasonal allergies    benadryl  prn   Hypertension    IBS (irritable bowel syndrome)    IIH (idiopathic intracranial hypertension)    on lasix prior to pregnancy   Major depressive disorder    Migraine    Neck pain    PONV (postoperative nausea and vomiting)    PP care - s/p 1C/S 1/19 06/20/2011   Pseudotumor cerebri    PTSD (post-traumatic stress disorder)    Sinusitis    Strep throat     Tachycardia, unspecified     Past Surgical History:  Procedure Laterality Date   CESAREAN SECTION  06/20/2011   Procedure: CESAREAN SECTION;  Surgeon: Charlie JINNY Flowers, MD;  Location: [facility];  Service: Gynecology;  Laterality: N/A;  Primary  EDD: 07/15/11/Hx of shoulder dystocia.  Primary cesarean section with delivery of baby boy at 6. Apgars 9/9.   CESAREAN SECTION     2013-2014   CHOLECYSTECTOMY  01/2003   CHOLECYSTECTOMY     HYSTEROTOMY  07/2013   NO PAST SURGERIES     SVD  07/27/1999   fetal demise - shoulder dystocia   WISDOM TOOTH EXTRACTION      Family History  Problem Relation Age of Onset   Diabetes Mother    Heart disease Mother    Asthma Mother    Cancer Mother    Heart attack Father    Diabetes Father    Hyperlipidemia  Sister    Diabetes Maternal Grandmother    Diabetes Maternal Grandfather    Migraines Son    Healthy Son    Healthy Daughter     Social History   Socioeconomic History   Marital status: Married    Spouse name: Not on file   Number of children: 2   Years of education: Not on file   Highest education level: Not on file  Occupational History   Occupation: Pharmacy Tech  Tobacco Use   Smoking status: Former    Current packs/day: 0.50    Average packs/day: 0.5 packs/day for 27.5 years (13.7 ttl pk-yrs)    Types: Cigarettes    Start date: 1998   Smokeless tobacco: Never  Vaping Use   Vaping status: Never Used  Substance and Sexual Activity   Alcohol use: Yes    Comment: rarely   Drug use: Never   Sexual activity: Yes    Partners: Male    Birth control/protection: None    Comment: pregnant  Other Topics Concern   Not on file  Social History Narrative   ** Merged History Encounter **       Right handed Lives in two story home with husband and 2 children   Social Drivers of Health   Financial Resource Strain: Low Risk  (09/24/2023)   Received from [facility]   Overall Financial Resource Strain (CARDIA)    Difficulty of  Paying Living Expenses: Not very hard  Food Insecurity: No Food Insecurity (09/24/2023)   [facility]   Hunger Vital Sign    Within the past 12 months, you worried that your food would run out before you got the money to buy more.: Never true    Within the past 12 months, the food you bought just didn't last and you didn't have money to get more.: Never true  Transportation Needs: No Transportation Needs (09/24/2023)   Received [facility] - [facility]    Lack of Transportation (Medical): No    Lack of Transportation (Non-Medical): No  Physical Activity: Unknown (09/24/2023)   Received from [facility]   Exercise Vital Sign    On average, how many days per week do you engage in moderate to strenuous exercise (like a brisk walk)?: 0 days    Minutes of Exercise per Session: Not on file  Stress: Stress Concern Present (09/24/2023)   Received from [facility] - Occupational Stress Questionnaire    Feeling of Stress : Rather much  Social Connections: Socially Isolated (09/24/2023)   Received from [facility]   Social Network    How would you rate your social network (family, work, friends)?: Little participation, lonely and socially isolated  Intimate Partner Violence: Not At Risk (09/24/2023)   Received from [facility]   HITS    Over the last 12 months how often did your partner physically hurt you?: Never    Over the last 12 months how often did your partner insult you or talk down to you?: Sometimes    Over the last 12 months how often did your partner threaten you with physical harm?: Never    Over the last 12 months how often did your partner scream or curse at you?: Rarely    ROS: as noted in HPI  Objective:  BP 127/80   Pulse (!) 102   Ht 5' 6 (1.676 m)   Wt 255 lb (115.7 kg)  LMP 11/26/2010   SpO2 100%   BMI 41.16 kg/m   Physical Exam Vitals and nursing note reviewed.   Constitutional:      General: She is not in acute distress.    Appearance: Normal appearance. She is not ill-appearing, toxic-appearing or diaphoretic.  HENT:     Head: Normocephalic and atraumatic.     Right Ear: Tympanic membrane, ear canal and external ear normal. There is no impacted cerumen.     Left Ear: Tympanic membrane, ear canal and external ear normal. There is no impacted cerumen.     Nose: Nose normal.     Mouth/Throat:     Mouth: Mucous membranes are moist.     Pharynx: Oropharynx is clear. No oropharyngeal exudate or posterior oropharyngeal erythema.   Eyes:     General: No scleral icterus.       Right eye: No discharge.        Left eye: No discharge.     Extraocular Movements: Extraocular movements intact.     Pupils: Pupils are equal, round, and reactive to light.   Neck:     Thyroid: No thyroid mass, thyromegaly or thyroid tenderness.   Cardiovascular:     Rate and Rhythm: Normal rate and regular rhythm.     Pulses: Normal pulses.     Heart sounds: No murmur heard. Pulmonary:     Effort: Pulmonary effort is normal. No respiratory distress.     Breath sounds: Normal breath sounds. No stridor. No wheezing or rhonchi.  Abdominal:     General: Abdomen is flat. Bowel sounds are normal. There is no distension.     Palpations: Abdomen is soft. There is no mass.     Tenderness: There is no abdominal tenderness. There is no guarding.   Musculoskeletal:     Cervical back: Normal range of motion and neck supple. No rigidity or tenderness.     Right lower leg: No edema.     Left lower leg: No edema.  Lymphadenopathy:     Cervical: No cervical adenopathy.   Skin:    General: Skin is warm and dry.     Coloration: Skin is not jaundiced.     Findings: Rash (guttate psoriasis noted to chest and arms) present. No bruising or erythema.   Neurological:     General: No focal deficit present.     Mental Status: She is alert and oriented to person, place, and time.      Sensory: No sensory deficit.     Motor: No weakness.   Psychiatric:        Mood and Affect: Mood normal.        Behavior: Behavior normal.     Last CBC Lab Results  Component Value Date   WBC 8.1 03/28/2021   HGB 13.0 03/28/2021   HCT 39.2 03/28/2021   MCV 90.7 03/28/2021   MCH 30.1 03/28/2021   RDW 12.1 03/28/2021   PLT 375 03/28/2021   Last metabolic panel Lab Results  Component Value Date   GLUCOSE 94 03/28/2021   NA 140 03/28/2021   K 3.9 03/28/2021   CL 103 03/28/2021   CO2 26 03/28/2021   BUN 10 03/28/2021   CREATININE 0.89 03/28/2021   EGFR 83 03/28/2021   CALCIUM 8.8 03/28/2021   PROT 6.5 03/28/2021   PROT 6.8 03/28/2021   ALBUMIN 3.7 07/15/2020   BILITOT 0.3 03/28/2021   ALKPHOS 72 07/15/2020   AST 14 03/28/2021   ALT 18 03/28/2021  ANIONGAP 8 07/15/2020   Last lipids No results found for: CHOL, HDL, LDLCALC, LDLDIRECT, TRIG, CHOLHDL Last hemoglobin A1c No results found for: HGBA1C Last thyroid functions No results found for: TSH, T3TOTAL, T4TOTAL, THYROIDAB Last vitamin D No results found for: 25OHVITD2, 25OHVITD3, VD25OH Last vitamin B12 and Folate Lab Results  Component Value Date   VITAMINB12 256 04/09/2020      Assessment & Plan:  Psoriasis -     Triamcinolone Acetonide -     Clobetasol Propionate; Apply 1 Application topically 2 (two) times daily.  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 Managed with Cosentyx and Kenalog injections. Scalp itching present. -  Administer Kenalog 80 mg injection. - Prescribe clobetasol solution for scalp itching. - Evaluate continuation of Cosentyx therapy  Thyroid Nodules Thyroid nodules with one requiring follow-up. Symptoms suggestive of thyroid dysfunction. Previous low TSH. - Schedule thyroid ultrasound in October. - Order thyroid function tests.  ADHD ADHD managed with Strattera and nortriptyline. Improvement noted since discontinuing Prozac. Current regimen effective. - Continue Strattera and nortriptyline. - Continue weekly therapy sessions.  Hypertension Hypertension managed with metoprolol. Blood pressure well-controlled. - Continue metoprolol 50 mg twice daily.  Migraine Migraine frequency decreased significantly. Current treatment effective. - Continue Nurtec as needed.  General Health Maintenance Up to date with pap smear and mammogram. Plans for lifestyle changes. - Schedule annual physical in October. - Encourage lifestyle modifications including diet and exercise.        Return in about 4 months (around 03/24/2024) for Annual Physical.   Benton LITTIE Gave, PA

## 2023-11-23 NOTE — Patient Instructions (Addendum)
 Return around October for annual PE.

## 2023-11-23 NOTE — Telephone Encounter (Signed)
 [facility] -  Questioning why Cosentyx is written as low dose ( states this is a pediatric dose ) - just wanting to verify reason for low dosing of medication. Return contact # [phone] ask for [facility]

## 2023-11-24 ENCOUNTER — Ambulatory Visit: Payer: Self-pay | Admitting: Urgent Care

## 2023-11-24 LAB — LIPID PANEL
Chol/HDL Ratio: 4.8 ratio — ABNORMAL HIGH (ref 0.0–4.4)
Cholesterol, Total: 198 mg/dL (ref 100–199)
HDL: 41 mg/dL (ref >39)
LDL Chol Calc (NIH): 131 mg/dL — ABNORMAL HIGH (ref 0–99)
Triglycerides: 144 mg/dL (ref 0–149)
VLDL Cholesterol Cal: 26 mg/dL (ref 5–40)

## 2023-11-24 LAB — CMP14+EGFR
ALT: 15 IU/L (ref 0–32)
AST: 12 IU/L (ref 0–40)
Albumin: 3.8 g/dL — ABNORMAL LOW (ref 3.9–4.9)
Alkaline Phosphatase: 97 IU/L (ref 44–121)
BUN/Creatinine Ratio: 9 (ref 9–23)
BUN: 9 mg/dL (ref 6–24)
Bilirubin Total: 0.3 mg/dL (ref 0.0–1.2)
CO2: 21 mmol/L (ref 20–29)
Calcium: 8.8 mg/dL (ref 8.7–10.2)
Chloride: 101 mmol/L (ref 96–106)
Creatinine, Ser: 1 mg/dL (ref 0.57–1.00)
Globulin, Total: 2.8 g/dL (ref 1.5–4.5)
Glucose: 93 mg/dL (ref 70–99)
Potassium: 3.8 mmol/L (ref 3.5–5.2)
Sodium: 139 mmol/L (ref 134–144)
Total Protein: 6.6 g/dL (ref 6.0–8.5)
eGFR: 71 mL/min/{1.73_m2} (ref >59)

## 2023-11-24 LAB — CBC WITH DIFFERENTIAL/PLATELET
Basophils Absolute: 0.1 10*3/uL (ref 0.0–0.2)
Basos: 1 %
EOS (ABSOLUTE): 0.4 10*3/uL (ref 0.0–0.4)
Eos: 4 %
Hematocrit: 42.5 % (ref 34.0–46.6)
Hemoglobin: 13.3 g/dL (ref 11.1–15.9)
Immature Grans (Abs): 0 10*3/uL (ref 0.0–0.1)
Immature Granulocytes: 0 %
Lymphocytes Absolute: 2.6 10*3/uL (ref 0.7–3.1)
Lymphs: 30 %
MCH: 30.1 pg (ref 26.6–33.0)
MCHC: 31.3 g/dL — ABNORMAL LOW (ref 31.5–35.7)
MCV: 96 fL (ref 79–97)
Monocytes Absolute: 0.7 10*3/uL (ref 0.1–0.9)
Monocytes: 8 %
Neutrophils Absolute: 5 10*3/uL (ref 1.4–7.0)
Neutrophils: 57 %
Platelets: 356 10*3/uL (ref 150–450)
RBC: 4.42 x10E6/uL (ref 3.77–5.28)
RDW: 12.6 % (ref 11.7–15.4)
WBC: 8.8 10*3/uL (ref 3.4–10.8)

## 2023-11-24 LAB — TSH: TSH: 2.44 u[IU]/mL (ref 0.450–4.500)

## 2023-11-24 LAB — T4, FREE: Free T4: 1.12 ng/dL (ref 0.82–1.77)

## 2023-11-25 ENCOUNTER — Telehealth: Payer: Self-pay

## 2023-11-25 ENCOUNTER — Other Ambulatory Visit (HOSPITAL_COMMUNITY): Payer: Self-pay

## 2023-11-25 NOTE — Telephone Encounter (Signed)
 It is prescribed as 300mg  subcutaneous every week x 5 weeks.

## 2023-11-25 NOTE — Telephone Encounter (Signed)
 Pharmacy Patient Advocate Encounter   Received notification from CoverMyMeds that prior authorization for Cosentyx (300mg  dose) 150mg /ml  is required/requested.   Insurance verification completed.   The patient is insured through [facility] .   Per test claim: PA required; PA started via CoverMyMeds. KEY [id number] . Please see clinical question(s) below that I am not finding the answer to in their chart and advise.    Is the Cosentyx going to be administered every week or every 4 weeks? Please advise

## 2023-11-25 NOTE — Telephone Encounter (Signed)
 Clinical questions answered and prior auth submitted

## 2023-11-26 NOTE — Telephone Encounter (Signed)
 Pharmacy Patient Advocate Encounter  Received notification from [facility] that Prior Authorization for Cosentyx  300mg  dose has been DENIED.  See denial reason below. No denial letter attached in CMM. Will attach denial letter to Media tab once received.

## 2023-11-30 DIAGNOSIS — R0683 Snoring: Secondary | ICD-10-CM | POA: Diagnosis not present

## 2023-11-30 DIAGNOSIS — R1310 Dysphagia, unspecified: Secondary | ICD-10-CM | POA: Diagnosis not present

## 2023-11-30 DIAGNOSIS — R09A2 Foreign body sensation, throat: Secondary | ICD-10-CM | POA: Diagnosis not present

## 2023-11-30 DIAGNOSIS — K219 Gastro-esophageal reflux disease without esophagitis: Secondary | ICD-10-CM | POA: Diagnosis not present

## 2023-11-30 DIAGNOSIS — E785 Hyperlipidemia, unspecified: Secondary | ICD-10-CM | POA: Diagnosis not present

## 2023-11-30 DIAGNOSIS — Z888 Allergy status to other drugs, medicaments and biological substances status: Secondary | ICD-10-CM | POA: Diagnosis not present

## 2023-11-30 DIAGNOSIS — R198 Other specified symptoms and signs involving the digestive system and abdomen: Secondary | ICD-10-CM | POA: Diagnosis not present

## 2023-11-30 DIAGNOSIS — K648 Other hemorrhoids: Secondary | ICD-10-CM | POA: Diagnosis not present

## 2023-11-30 DIAGNOSIS — G629 Polyneuropathy, unspecified: Secondary | ICD-10-CM | POA: Diagnosis not present

## 2023-11-30 DIAGNOSIS — F418 Other specified anxiety disorders: Secondary | ICD-10-CM | POA: Diagnosis not present

## 2023-11-30 DIAGNOSIS — Z79899 Other long term (current) drug therapy: Secondary | ICD-10-CM | POA: Diagnosis not present

## 2023-11-30 DIAGNOSIS — Z6841 Body Mass Index (BMI) 40.0 and over, adult: Secondary | ICD-10-CM | POA: Diagnosis not present

## 2023-11-30 DIAGNOSIS — M1990 Unspecified osteoarthritis, unspecified site: Secondary | ICD-10-CM | POA: Diagnosis not present

## 2023-11-30 DIAGNOSIS — Z881 Allergy status to other antibiotic agents status: Secondary | ICD-10-CM | POA: Diagnosis not present

## 2023-11-30 DIAGNOSIS — K580 Irritable bowel syndrome with diarrhea: Secondary | ICD-10-CM | POA: Diagnosis not present

## 2023-11-30 DIAGNOSIS — D123 Benign neoplasm of transverse colon: Secondary | ICD-10-CM | POA: Diagnosis not present

## 2023-11-30 DIAGNOSIS — Z87891 Personal history of nicotine dependence: Secondary | ICD-10-CM | POA: Diagnosis not present

## 2023-11-30 DIAGNOSIS — R1031 Right lower quadrant pain: Secondary | ICD-10-CM | POA: Diagnosis not present

## 2023-11-30 DIAGNOSIS — R1032 Left lower quadrant pain: Secondary | ICD-10-CM | POA: Diagnosis not present

## 2023-11-30 DIAGNOSIS — G4733 Obstructive sleep apnea (adult) (pediatric): Secondary | ICD-10-CM | POA: Diagnosis not present

## 2023-11-30 DIAGNOSIS — I10 Essential (primary) hypertension: Secondary | ICD-10-CM | POA: Diagnosis not present

## 2023-12-02 DIAGNOSIS — F411 Generalized anxiety disorder: Secondary | ICD-10-CM | POA: Diagnosis not present

## 2023-12-02 DIAGNOSIS — F332 Major depressive disorder, recurrent severe without psychotic features: Secondary | ICD-10-CM | POA: Diagnosis not present

## 2023-12-02 DIAGNOSIS — F4312 Post-traumatic stress disorder, chronic: Secondary | ICD-10-CM | POA: Diagnosis not present

## 2023-12-07 DIAGNOSIS — F332 Major depressive disorder, recurrent severe without psychotic features: Secondary | ICD-10-CM | POA: Diagnosis not present

## 2023-12-07 DIAGNOSIS — F411 Generalized anxiety disorder: Secondary | ICD-10-CM | POA: Diagnosis not present

## 2023-12-07 DIAGNOSIS — F4312 Post-traumatic stress disorder, chronic: Secondary | ICD-10-CM | POA: Diagnosis not present

## 2023-12-09 ENCOUNTER — Telehealth: Payer: Self-pay

## 2023-12-09 DIAGNOSIS — F411 Generalized anxiety disorder: Secondary | ICD-10-CM | POA: Diagnosis not present

## 2023-12-09 DIAGNOSIS — F4312 Post-traumatic stress disorder, chronic: Secondary | ICD-10-CM | POA: Diagnosis not present

## 2023-12-09 DIAGNOSIS — F332 Major depressive disorder, recurrent severe without psychotic features: Secondary | ICD-10-CM | POA: Diagnosis not present

## 2023-12-09 NOTE — Telephone Encounter (Signed)
 Copied from CRM [id number]. Topic: Clinical - Medication Prior Auth >> Dec 08, 2023  3:00 PM Fredrica W wrote: Reason for CRM: Patient called to check status of Request for medication Secukinumab , 300 MG Dose, (COSENTYX , 300 MG DOSE,) 150 MG/ML SOSY. States she did not have access to my chart and has not heard anything back. Resent link to sign up for MyChart. She would like a call back in the meantime about the update on the medication. Thank You

## 2023-12-09 NOTE — Telephone Encounter (Signed)
 She did reach out to to me via Mychart. I had told her that her insurance had denied her script for the cosentyx  how it was written. Any med adjustments beyond loading dose should be done through a specialist. I am only authorized to write for how it is indicated, which for her condition is weekly x 5 weeks. I had recommended that she follow up with dermatology to have this script continued.

## 2023-12-14 ENCOUNTER — Ambulatory Visit: Payer: Self-pay | Admitting: Urgent Care

## 2023-12-15 DIAGNOSIS — F4312 Post-traumatic stress disorder, chronic: Secondary | ICD-10-CM | POA: Diagnosis not present

## 2023-12-15 DIAGNOSIS — F332 Major depressive disorder, recurrent severe without psychotic features: Secondary | ICD-10-CM | POA: Diagnosis not present

## 2023-12-15 DIAGNOSIS — F411 Generalized anxiety disorder: Secondary | ICD-10-CM | POA: Diagnosis not present

## 2023-12-17 DIAGNOSIS — F4310 Post-traumatic stress disorder, unspecified: Secondary | ICD-10-CM | POA: Diagnosis not present

## 2023-12-17 DIAGNOSIS — F5101 Primary insomnia: Secondary | ICD-10-CM | POA: Diagnosis not present

## 2023-12-17 DIAGNOSIS — F330 Major depressive disorder, recurrent, mild: Secondary | ICD-10-CM | POA: Diagnosis not present

## 2023-12-17 DIAGNOSIS — F411 Generalized anxiety disorder: Secondary | ICD-10-CM | POA: Diagnosis not present

## 2023-12-21 DIAGNOSIS — F332 Major depressive disorder, recurrent severe without psychotic features: Secondary | ICD-10-CM | POA: Diagnosis not present

## 2023-12-21 DIAGNOSIS — F4312 Post-traumatic stress disorder, chronic: Secondary | ICD-10-CM | POA: Diagnosis not present

## 2023-12-21 DIAGNOSIS — F411 Generalized anxiety disorder: Secondary | ICD-10-CM | POA: Diagnosis not present

## 2023-12-28 DIAGNOSIS — F411 Generalized anxiety disorder: Secondary | ICD-10-CM | POA: Diagnosis not present

## 2023-12-28 DIAGNOSIS — F332 Major depressive disorder, recurrent severe without psychotic features: Secondary | ICD-10-CM | POA: Diagnosis not present

## 2023-12-28 DIAGNOSIS — F4312 Post-traumatic stress disorder, chronic: Secondary | ICD-10-CM | POA: Diagnosis not present

## 2024-01-04 DIAGNOSIS — F332 Major depressive disorder, recurrent severe without psychotic features: Secondary | ICD-10-CM | POA: Diagnosis not present

## 2024-01-04 DIAGNOSIS — F4312 Post-traumatic stress disorder, chronic: Secondary | ICD-10-CM | POA: Diagnosis not present

## 2024-01-04 DIAGNOSIS — F411 Generalized anxiety disorder: Secondary | ICD-10-CM | POA: Diagnosis not present

## 2024-01-05 NOTE — Progress Notes (Unsigned)
 NEUROLOGY FOLLOW UP OFFICE NOTE  Briannah Lona [id number]  Assessment/Plan:   Migraine with aura, without status migrainosus, not intractable Paresthesias, diffuse History of idiopathic intracranial hypertension   Migraine prevention:  Will restart Emgality  *** Migraine rescue:  Restart Nurtec *** Limit use of pain relievers to no more than 9 days out of the month to prevent risk of rebound or medication-overuse headache. Keep headache diary Follow up 6 months.    Subjective:  Mariene Lynn Malmberg is a 43 year old Caucasian woman with PCOS, psoriasis, PTSD and depression who follows up for migraines   UPDATE: Last seen in May 2022.  Migraines: At that time, plan was to restart Emgality .  ***  Paresthesias: NCV-EMG on 02/19/2022 was normal.   Current NSAIDS:  ibuprofen  Current analgesics:  none Current triptans:  none Current ergotamine:  none Current anti-emetic:  none Current muscle relaxants:  Flexeril  10mg  Current anti-anxiolytic:  Xanax Current sleep aide:  none Current Antihypertensive medications:  Metoprolol succinate 50mg  BID  (for tachycardia) Current Antidepressant medications:  Prozac 20mg  twice daily Current Anticonvulsant medications:  none Current anti-CGRP: none Current Vitamins/Herbal/Supplements:  D3 Current Antihistamines/Decongestants:  none Other therapy:  none Hormone/birth control:  none   Caffeine:  No coffee.  Occasional Coke Alcohol:  rarely Smoker:  2-3 cigarettes daily Diet:  16 oz water daily.  Skips meals.   Exercise:  yes Depression:  improving; Anxiety:  Improving.  Sees a therapist. Other pain:  Neck pain.  Sometimes associated with pain and numbness into the fingers involving either arm) Sleep hygiene:  Some trouble falling asleep   HISTORY:  Migraines: Onset:  1996 Location:  Left sided from front to back and down the side of neck. Quality:  Sometimes throbbing, sometimes shooting Initial intensity:  8/10.  She denies  new headache, thunderclap headache Aura:  no Premonitory Phase:  lethargy Postdrome:  lethargy Associated symptoms:  Nausea, sometimes vomiting, photophobia, phonophobia, osmophobia, blurred vision/clouding of vision, dizziness.  She denies associated unilateral numbness or weakness. Initial duration:  1 to 3 or 4 days Initial frequency:  Once a week (5 to 10 days a month on average) Initial frequency of abortive medication: maybe once a week Triggers:  Change in weather, emotional stress Relieving factors:  Ice packs Activity:  Aggravates  Idiopathic intracranial hypertension:  She was diagnosed with idiopathic intracranial hypertension in 2011.  She presented with severe headaches.  Eye exam revealed papilledema.  She did have prior brain MRI over a decade ago.  She was previously on Diamox and Lasix.  Last LP was in 2012 while pregnant.  No symptoms since then.  Denies visual disturbance other than with headache.  She saw Dr. Maree of ophthalmology in May 2022 whose exam did not demonstrate papilledema or VF deficits.   Paresthesias: Since approximately 2013, she reports intermittent numbness and tingling various parts of her body and burning on back of neck, shoulders and sometimes down the arms or down the legs.  They often occur a couple of times a week and may last up to 3 hours.  She reports that she has decreased sensation worse on her left side when compared to her right sided.  She saw a spine specialist.  MRI of cervical, thoracic and lumbar spine on 10/20/2019 personally reviewed was unremarkable with minimal spurring at C4-5 and mild lumbar facet hypertrophy but nothing significant that would explain her symptoms.  MRI of brain with and without contrast on 01/22/2020 was normal.  TSH from 03/11/2020  was 2.26.  B12 from November 2021 was 256.  Advised to start B12 1000mcg daily.  No improvement in paresthesias since starting B12.   Post-COVID symptoms: She had contracted COVID in early  January 2022. She started having dizziness and balance problems.  She had confusion and trouble remembering things such as her phone number.  She started having new headaches, an intermittent sharp shooting pain in the right parietal region and behind right ear with persistent head pressure and pressure in right ear. Ear would pop.  Has seen her ENT about this.  Endorses generalized pain.  She has had labile hypertension and tachycardia.  She reported blood pressures as high as 188/101 and HR 105-110.  Metoprolol was increased by her PCP yesterday.  She went to the ED on 07/15/2020 where CT of head was performed, which was personally reviewed and was negative.    Past medications: Past NSAIDS:  Aleve Past analgesics:  Tylenol , Excedrin, possibly Fioricet Past abortive triptans:  Sumatriptan [place] (chest tightness/difficulty breathing), Maxalt (ineffective) Past abortive ergotamine:  no Past muscle relaxants:  tizanidine  Past anti-emetic:  promethazine  Past antihypertensive medications:  no Past antidepressant medications:  Nortriptyline, sertraline, maybe amitriptyline, Celexa , Effexor Past anticonvulsant medications:  topiramate (kidney issues) Past anti-CGRP:  Aimovig  (ineffective), Ubrelvy  100mg , Emgality  (effective), Nurtec PRN (effective) Past vitamins/Herbal/Supplements:  no Past antihistamines/decongestants:  no Other past therapies:  no     Family history of headache:  Second cousin (IIH, brain tumor); great grandfather both had IIH  PAST MEDICAL HISTORY: Past Medical History:  Diagnosis Date   Anxiety    celexa  daily   Arthritis    hands/wrist - no meds   Chronic GERD    Depression    celexa  daily   Dysrhythmia    hx irregular heart beats - no current prob, stress related.   Eczema    GERD (gastroesophageal reflux disease)    tums prn   Headache(784.0)    OTC prn   History of seasonal allergies    benadryl  prn   Hypertension    IBS (irritable bowel syndrome)    IIH  (idiopathic intracranial hypertension)    on lasix prior to pregnancy   Major depressive disorder    Migraine    Neck pain    PONV (postoperative nausea and vomiting)    PP care - s/p 1C/S 1/19 06/20/2011   Pseudotumor cerebri    PTSD (post-traumatic stress disorder)    Sinusitis    Strep throat    Tachycardia, unspecified     MEDICATIONS: Current Outpatient Medications on File Prior to Visit  Medication Sig Dispense Refill   albuterol (VENTOLIN HFA) 108 (90 Base) MCG/ACT inhaler Inhale into the lungs.     ALPRAZolam (XANAX) 0.5 MG tablet Take 0.5 mg by mouth 3 (three) times daily as needed for anxiety.     atomoxetine (STRATTERA) 80 MG capsule Take 80 mg by mouth daily.     clobetasol  (TEMOVATE ) 0.05 % external solution Apply 1 Application topically 2 (two) times daily. 50 mL 2   cyclobenzaprine  (FLEXERIL ) 10 MG tablet TAKE 1 TABLET(10 MG) BY MOUTH AT BEDTIME AS NEEDED FOR MUSCLE SPASMS 30 tablet 3   ibuprofen  (ADVIL ) 200 MG tablet Take 800 mg by mouth every 6 (six) hours as needed for mild pain.     Metoprolol Succinate 25 MG CS24 Take 12.5 mg by mouth in the morning and at bedtime. Half tab in am, half tab in pm (Patient taking differently: Take 50 mg by mouth  in the morning and at bedtime.)     nortriptyline (PAMELOR) 25 MG capsule Take 25 mg by mouth at bedtime.     pantoprazole (PROTONIX) 40 MG tablet Take 40 mg by mouth daily.     Rimegepant Sulfate (NURTEC) 75 MG TBDP Take 75 mg by mouth daily as needed. 16 tablet 5   Secukinumab , 300 MG Dose, (COSENTYX , 300 MG DOSE,) 150 MG/ML SOSY Inject 2 mLs (300 mg total) into the skin once a week. 10 mL 0   zaleplon (SONATA) 10 MG capsule Take 10 mg by mouth at bedtime.     No current facility-administered medications on file prior to visit.    ALLERGIES: Allergies  Allergen Reactions   Azithromycin Nausea And Vomiting   Hydrocodone Other (See Comments)   Other Nausea And Vomiting    MRI contrast   Topamax [Topiramate] Other (See  Comments)    Kidney stones   Vicodin [Hydrocodone-Acetaminophen ] Itching    FAMILY HISTORY: Family History  Problem Relation Age of Onset   Diabetes Mother    Heart disease Mother    Asthma Mother    Cancer Mother    Heart attack Father    Diabetes Father    Hyperlipidemia Sister    Diabetes Maternal Grandmother    Diabetes Maternal Grandfather    Migraines Son    Healthy Son    Healthy Daughter       Objective:  *** General: No acute distress.  Patient appears well-groomed.   Head:  Normocephalic/atraumatic Neck:  Supple.  No paraspinal tenderness.  Full range of motion. Heart:  Regular rate and rhythm. Neuro:  Alert and oriented.  Speech fluent and not dysarthric.  Language intact.  CN II-XII intact.  Bulk and tone normal.  Muscle strength 5/5 throughout.  Sensation to light touch intact.  Deep tendon reflexes 2+ throughout, toes downgoing.  Gait normal.  Romberg negative.    Juliene Dunnings, DO  CC: Benton Gave, [place]

## 2024-01-06 ENCOUNTER — Other Ambulatory Visit (HOSPITAL_COMMUNITY): Payer: Self-pay

## 2024-01-06 ENCOUNTER — Encounter: Payer: Self-pay | Admitting: Neurology

## 2024-01-06 ENCOUNTER — Ambulatory Visit: Payer: Self-pay | Admitting: Neurology

## 2024-01-06 ENCOUNTER — Telehealth: Payer: Self-pay | Admitting: Pharmacy Technician

## 2024-01-06 ENCOUNTER — Telehealth: Payer: Self-pay

## 2024-01-06 VITALS — BP 131/79 | HR 117 | Resp 20 | Ht 66.0 in | Wt 262.0 lb

## 2024-01-06 DIAGNOSIS — G43709 Chronic migraine without aura, not intractable, without status migrainosus: Secondary | ICD-10-CM

## 2024-01-06 DIAGNOSIS — G932 Benign intracranial hypertension: Secondary | ICD-10-CM | POA: Diagnosis not present

## 2024-01-06 MED ORDER — NURTEC 75 MG PO TBDP: 75.0000 mg | ORAL_TABLET | Freq: Every day | ORAL | 11 refills | Status: DC | PRN

## 2024-01-06 NOTE — Telephone Encounter (Signed)
 Please Initiate new Botox start for patient per [facility]

## 2024-01-06 NOTE — Telephone Encounter (Signed)
 Pharmacy Patient Advocate Encounter   Received notification from Pt Calls Messages that prior authorization for BOTOX 200 is required/requested.   Insurance verification completed.   The patient is insured through [facility] .   Per test claim: PA required; PA submitted to above mentioned insurance via CoverMyMeds Key/confirmation #/EOC [id number] Status is pending  Botox[facility] BIV: [id number]  Dx: G43.709 CPT Code: 35384

## 2024-01-06 NOTE — Patient Instructions (Signed)
 Plan to start Botox Nurtec once daily as needed Limit use of pain relievers to no more than 9 days out of the month to prevent risk of rebound or medication-overuse headache. Keep headache diary Follow up 7 months.

## 2024-01-06 NOTE — Telephone Encounter (Signed)
 Pharmacy Patient Advocate Encounter- Injection via Medical Benefit:  Pharmacy provided J code: Botox- [id number]  PA was submitted to [facility] and has been approved through: 8.7.25 - 1.22.26 Authorization# [id number]  Please send prescription to Specialty Pharmacy: [facility]: [phone] Estimated Patient cost is: ?  Patient IS eligible for Botox- [id number] Copay Card. Copay Card can make patient's cost as little as zero. Copay card will be provided to pharmacy.

## 2024-01-06 NOTE — Telephone Encounter (Signed)
 PA has been submitted, and telephone encounter has been created. Please see telephone encounter dated 8.7.25.

## 2024-01-07 MED ORDER — ONABOTULINUMTOXINA 200 UNITS IJ SOLR: INTRAMUSCULAR | 4 refills | Status: DC

## 2024-01-07 NOTE — Telephone Encounter (Signed)
 Patient advised Via mychart.   Script sent to [facility].

## 2024-01-10 ENCOUNTER — Other Ambulatory Visit (HOSPITAL_COMMUNITY): Payer: Self-pay

## 2024-01-10 NOTE — Telephone Encounter (Signed)
 [facility]otox[facility] BIV Report:  CPT Code PA/Pre-D NOT required

## 2024-01-11 ENCOUNTER — Other Ambulatory Visit: Payer: Self-pay | Admitting: Neurology

## 2024-01-11 DIAGNOSIS — F4312 Post-traumatic stress disorder, chronic: Secondary | ICD-10-CM | POA: Diagnosis not present

## 2024-01-11 DIAGNOSIS — F332 Major depressive disorder, recurrent severe without psychotic features: Secondary | ICD-10-CM | POA: Diagnosis not present

## 2024-01-11 DIAGNOSIS — F411 Generalized anxiety disorder: Secondary | ICD-10-CM | POA: Diagnosis not present

## 2024-01-11 MED ORDER — CYCLOBENZAPRINE HCL 10 MG PO TABS: 10.0000 mg | ORAL_TABLET | Freq: Every evening | ORAL | 5 refills | Status: AC | PRN

## 2024-01-11 MED ORDER — ONABOTULINUMTOXINA 200 UNITS IJ SOLR: INTRAMUSCULAR | 4 refills | Status: AC

## 2024-01-11 NOTE — Telephone Encounter (Signed)
 Patient advised of Botox approved. Script sent to [facility] appt made for 9/12 at 7:50 am.   Per patient she forgot to ask Dr.Jaffe for a refill on Flexeril  at her visit.   Please advise.

## 2024-01-11 NOTE — Addendum Note (Signed)
 Addended by: OZELL JESUSA PARAS on: 01/11/2024 10:53 AM   Modules accepted: Orders

## 2024-01-13 MED ORDER — NURTEC 75 MG PO TBDP: 75.0000 mg | ORAL_TABLET | Freq: Every day | ORAL | 11 refills | Status: AC | PRN

## 2024-01-13 NOTE — Telephone Encounter (Signed)
 Per Dr.Jaffe, OK to resend Nurtec to her local pharmacy

## 2024-01-19 NOTE — Telephone Encounter (Signed)
 Fax received from [facility]  insurance information needed.   Insurance card faxed over to them.  PA team Do we have the approval letter and can that be faxed over please.

## 2024-01-20 NOTE — Telephone Encounter (Signed)
 Fax received from [facility] PA needed for Botox.   I faxed the faxed received to the PA fax number.

## 2024-01-25 DIAGNOSIS — F411 Generalized anxiety disorder: Secondary | ICD-10-CM | POA: Diagnosis not present

## 2024-01-25 DIAGNOSIS — F4312 Post-traumatic stress disorder, chronic: Secondary | ICD-10-CM | POA: Diagnosis not present

## 2024-01-25 DIAGNOSIS — F332 Major depressive disorder, recurrent severe without psychotic features: Secondary | ICD-10-CM | POA: Diagnosis not present

## 2024-01-26 ENCOUNTER — Telehealth: Payer: Self-pay | Admitting: Neurology

## 2024-01-26 NOTE — Telephone Encounter (Signed)
 [name] called in from [facility] and she stated that the Botox injection  has been approved for Pt. [name] stated that [facility] needed to be added as the provided > Please call them  at [phone] If you have questions. Thanks

## 2024-01-28 NOTE — Telephone Encounter (Signed)
 Advised patient of approval and we will work on updated approval letter.

## 2024-02-01 DIAGNOSIS — F4312 Post-traumatic stress disorder, chronic: Secondary | ICD-10-CM | POA: Diagnosis not present

## 2024-02-01 DIAGNOSIS — F332 Major depressive disorder, recurrent severe without psychotic features: Secondary | ICD-10-CM | POA: Diagnosis not present

## 2024-02-01 DIAGNOSIS — F411 Generalized anxiety disorder: Secondary | ICD-10-CM | POA: Diagnosis not present

## 2024-02-03 NOTE — Telephone Encounter (Signed)
 Request has been submitted to [facility] to update Provider to [facility].

## 2024-02-08 DIAGNOSIS — F411 Generalized anxiety disorder: Secondary | ICD-10-CM | POA: Diagnosis not present

## 2024-02-08 DIAGNOSIS — F4312 Post-traumatic stress disorder, chronic: Secondary | ICD-10-CM | POA: Diagnosis not present

## 2024-02-08 DIAGNOSIS — F332 Major depressive disorder, recurrent severe without psychotic features: Secondary | ICD-10-CM | POA: Diagnosis not present

## 2024-02-08 NOTE — Telephone Encounter (Signed)
 Spoke to Rep [place] with [facility] please resend letter of approval with their name as servicing provider on it.    Letter re faxed.  Will need to reschedule patient.    Patient made aware and appt rescheduled.

## 2024-02-11 ENCOUNTER — Ambulatory Visit: Admitting: Neurology

## 2024-02-14 DIAGNOSIS — F332 Major depressive disorder, recurrent severe without psychotic features: Secondary | ICD-10-CM | POA: Diagnosis not present

## 2024-02-14 DIAGNOSIS — F411 Generalized anxiety disorder: Secondary | ICD-10-CM | POA: Diagnosis not present

## 2024-02-14 DIAGNOSIS — F4312 Post-traumatic stress disorder, chronic: Secondary | ICD-10-CM | POA: Diagnosis not present

## 2024-02-17 DIAGNOSIS — F4312 Post-traumatic stress disorder, chronic: Secondary | ICD-10-CM | POA: Diagnosis not present

## 2024-02-17 DIAGNOSIS — F411 Generalized anxiety disorder: Secondary | ICD-10-CM | POA: Diagnosis not present

## 2024-02-22 DIAGNOSIS — F330 Major depressive disorder, recurrent, mild: Secondary | ICD-10-CM | POA: Diagnosis not present

## 2024-02-22 DIAGNOSIS — F5101 Primary insomnia: Secondary | ICD-10-CM | POA: Diagnosis not present

## 2024-02-22 DIAGNOSIS — F4310 Post-traumatic stress disorder, unspecified: Secondary | ICD-10-CM | POA: Diagnosis not present

## 2024-02-22 DIAGNOSIS — F411 Generalized anxiety disorder: Secondary | ICD-10-CM | POA: Diagnosis not present

## 2024-02-22 DIAGNOSIS — F332 Major depressive disorder, recurrent severe without psychotic features: Secondary | ICD-10-CM | POA: Diagnosis not present

## 2024-02-22 DIAGNOSIS — F4312 Post-traumatic stress disorder, chronic: Secondary | ICD-10-CM | POA: Diagnosis not present

## 2024-02-25 ENCOUNTER — Ambulatory Visit: Admitting: Neurology

## 2024-02-28 DIAGNOSIS — F411 Generalized anxiety disorder: Secondary | ICD-10-CM | POA: Diagnosis not present

## 2024-02-28 DIAGNOSIS — F4312 Post-traumatic stress disorder, chronic: Secondary | ICD-10-CM | POA: Diagnosis not present

## 2024-02-28 DIAGNOSIS — F332 Major depressive disorder, recurrent severe without psychotic features: Secondary | ICD-10-CM | POA: Diagnosis not present

## 2024-03-08 DIAGNOSIS — F332 Major depressive disorder, recurrent severe without psychotic features: Secondary | ICD-10-CM | POA: Diagnosis not present

## 2024-03-08 DIAGNOSIS — F411 Generalized anxiety disorder: Secondary | ICD-10-CM | POA: Diagnosis not present

## 2024-03-08 DIAGNOSIS — F4312 Post-traumatic stress disorder, chronic: Secondary | ICD-10-CM | POA: Diagnosis not present

## 2024-03-13 DIAGNOSIS — F411 Generalized anxiety disorder: Secondary | ICD-10-CM | POA: Diagnosis not present

## 2024-03-13 DIAGNOSIS — F332 Major depressive disorder, recurrent severe without psychotic features: Secondary | ICD-10-CM | POA: Diagnosis not present

## 2024-03-13 DIAGNOSIS — F4312 Post-traumatic stress disorder, chronic: Secondary | ICD-10-CM | POA: Diagnosis not present

## 2024-03-21 DIAGNOSIS — F4312 Post-traumatic stress disorder, chronic: Secondary | ICD-10-CM | POA: Diagnosis not present

## 2024-03-24 ENCOUNTER — Encounter: Admitting: Urgent Care

## 2024-03-27 ENCOUNTER — Telehealth: Payer: Self-pay

## 2024-03-27 DIAGNOSIS — F332 Major depressive disorder, recurrent severe without psychotic features: Secondary | ICD-10-CM | POA: Diagnosis not present

## 2024-03-27 DIAGNOSIS — F4312 Post-traumatic stress disorder, chronic: Secondary | ICD-10-CM | POA: Diagnosis not present

## 2024-03-27 DIAGNOSIS — F411 Generalized anxiety disorder: Secondary | ICD-10-CM | POA: Diagnosis not present

## 2024-03-27 NOTE — Telephone Encounter (Signed)
 Tried calling patient, no answer LMOVM, to remind that her Botox is in office and she can schedule to be seen this month if she would like to.   Or will she like to come pick it up to take to another provider please let us  know.

## 2024-03-29 DIAGNOSIS — F411 Generalized anxiety disorder: Secondary | ICD-10-CM | POA: Diagnosis not present

## 2024-03-29 DIAGNOSIS — F4312 Post-traumatic stress disorder, chronic: Secondary | ICD-10-CM | POA: Diagnosis not present

## 2024-03-30 DIAGNOSIS — F4312 Post-traumatic stress disorder, chronic: Secondary | ICD-10-CM | POA: Diagnosis not present

## 2024-03-30 DIAGNOSIS — F411 Generalized anxiety disorder: Secondary | ICD-10-CM | POA: Diagnosis not present

## 2024-03-30 DIAGNOSIS — F332 Major depressive disorder, recurrent severe without psychotic features: Secondary | ICD-10-CM | POA: Diagnosis not present

## 2024-04-04 DIAGNOSIS — F4312 Post-traumatic stress disorder, chronic: Secondary | ICD-10-CM | POA: Diagnosis not present

## 2024-04-04 DIAGNOSIS — F332 Major depressive disorder, recurrent severe without psychotic features: Secondary | ICD-10-CM | POA: Diagnosis not present

## 2024-04-04 DIAGNOSIS — F411 Generalized anxiety disorder: Secondary | ICD-10-CM | POA: Diagnosis not present

## 2024-04-05 ENCOUNTER — Encounter: Admitting: Urgent Care

## 2024-04-10 DIAGNOSIS — F411 Generalized anxiety disorder: Secondary | ICD-10-CM | POA: Diagnosis not present

## 2024-04-10 DIAGNOSIS — F332 Major depressive disorder, recurrent severe without psychotic features: Secondary | ICD-10-CM | POA: Diagnosis not present

## 2024-04-10 DIAGNOSIS — F4312 Post-traumatic stress disorder, chronic: Secondary | ICD-10-CM | POA: Diagnosis not present

## 2024-04-12 ENCOUNTER — Ambulatory Visit: Payer: Self-pay | Admitting: Urgent Care

## 2024-04-12 ENCOUNTER — Encounter: Payer: Self-pay | Admitting: Urgent Care

## 2024-04-12 ENCOUNTER — Ambulatory Visit (INDEPENDENT_AMBULATORY_CARE_PROVIDER_SITE_OTHER)

## 2024-04-12 ENCOUNTER — Ambulatory Visit (INDEPENDENT_AMBULATORY_CARE_PROVIDER_SITE_OTHER): Admitting: Urgent Care

## 2024-04-12 ENCOUNTER — Ambulatory Visit

## 2024-04-12 VITALS — BP 131/79 | HR 104 | Ht 66.0 in | Wt 250.0 lb

## 2024-04-12 DIAGNOSIS — F332 Major depressive disorder, recurrent severe without psychotic features: Secondary | ICD-10-CM | POA: Diagnosis not present

## 2024-04-12 DIAGNOSIS — Z0000 Encounter for general adult medical examination without abnormal findings: Secondary | ICD-10-CM | POA: Diagnosis not present

## 2024-04-12 DIAGNOSIS — E782 Mixed hyperlipidemia: Secondary | ICD-10-CM

## 2024-04-12 DIAGNOSIS — E041 Nontoxic single thyroid nodule: Secondary | ICD-10-CM

## 2024-04-12 DIAGNOSIS — G8929 Other chronic pain: Secondary | ICD-10-CM

## 2024-04-12 DIAGNOSIS — Z23 Encounter for immunization: Secondary | ICD-10-CM

## 2024-04-12 DIAGNOSIS — M7661 Achilles tendinitis, right leg: Secondary | ICD-10-CM

## 2024-04-12 DIAGNOSIS — M25571 Pain in right ankle and joints of right foot: Secondary | ICD-10-CM | POA: Diagnosis not present

## 2024-04-12 DIAGNOSIS — E042 Nontoxic multinodular goiter: Secondary | ICD-10-CM | POA: Diagnosis not present

## 2024-04-12 DIAGNOSIS — M79671 Pain in right foot: Secondary | ICD-10-CM

## 2024-04-12 DIAGNOSIS — F902 Attention-deficit hyperactivity disorder, combined type: Secondary | ICD-10-CM | POA: Diagnosis not present

## 2024-04-12 DIAGNOSIS — F411 Generalized anxiety disorder: Secondary | ICD-10-CM | POA: Diagnosis not present

## 2024-04-12 DIAGNOSIS — F322 Major depressive disorder, single episode, severe without psychotic features: Secondary | ICD-10-CM | POA: Diagnosis not present

## 2024-04-12 DIAGNOSIS — Z79899 Other long term (current) drug therapy: Secondary | ICD-10-CM

## 2024-04-12 MED ORDER — BUSPIRONE HCL 5 MG PO TABS: 5.0000 mg | ORAL_TABLET | Freq: Two times a day (BID) | ORAL | 1 refills | Status: DC

## 2024-04-12 MED ORDER — DICLOFENAC SODIUM 75 MG PO TBEC
75.0000 mg | DELAYED_RELEASE_TABLET | Freq: Two times a day (BID) | ORAL | 0 refills | Status: AC
Start: 2024-04-12 — End: ?

## 2024-04-12 NOTE — Patient Instructions (Signed)
 Please continue your pamelor as prescribed. We drew these levels today. Please start buspar 5mg  once daily x 1 week, then increase to 2x/ day.  Please go to [address] to get your ankle xray. I also ordered a thyroid  US   Please follow up in 3 weeks.

## 2024-04-12 NOTE — Progress Notes (Signed)
 Annual Wellness Visit     Patient: [name], Female    DOB: Jan 13, 1981, 43 y.o.   MRN: [id number]  Subjective  Chief Complaint  Patient presents with   Annual Exam    Fasting; Right heel pain; repeat thyroid  ultrasound    [name] is a 43 y.o. female who presents today for her Annual Wellness Visit. She reports consuming a general diet. The patient does not participate in regular exercise at present. She generally feels poorly. She reports sleeping poorly. She does have additional problems to discuss today.   HPI  Discussed the use of AI scribe software for clinical note transcription with the patient, who gave verbal consent to proceed.  History of Present Illness   [name] is a 43 year old female who presents for her annual exam.  She is experiencing worsening depressive symptoms, particularly since her divorce. She is currently on nortriptyline, which was increased to three capsules this month, and Strattera, but feels the medication is not helping yet. She has a history of trying multiple antidepressants including Celexa , Prozac, Latuda, Risperdal, Cymbalta, Effexor, and others, with the best response historically to Celexa  and Prozac. Her depression worsens when she does not have her children, which is every other week. She has lost about twelve pounds recently and reports poor nutrition, having only consumed a smoothie since Sunday. She also reports poor sleep, waking up several times throughout the night. She has had thoughts of being 'better off dead' but denies any active plans or intent to harm herself. No active SI. She is currently on a seven-day on, seven-day off schedule with her children, which she finds challenging. She has xanax for panic attacks but finds this too sedating.  She reports right heel pain around the calcaneus, worsened by walking or touching. She has a history of bone spurs and plantar fasciitis, and the pain has been present for a  couple of months.  She has a history of psoriasis, which is currently managed with clobetasol  for minor breakouts behind her ears. She has not had significant flare-ups recently and has not seen a dermatologist for Cosentyx  this year.  She has a history of shortness of breath, palpitations, and chest pain, which have been attributed to anxiety in the past. She has not seen cardiology this year but has in the past.       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   Carcinoma in situ of vagina 10/10/2021   Cyst of left ovary 10/10/2021   History of hysterectomy 10/10/2021   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   Primary insomnia 06/27/2021   Primary osteoarthritis of both hands 04/29/2021   Primary osteoarthritis of both feet 04/29/2021   Psoriasis 04/29/2021   DDD (degenerative disc disease), cervical 04/29/2021   DDD (degenerative disc disease), 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/29/2021   Arthropathy of lumbar facet joint 04/29/2021   Fibromyalgia syndrome 04/29/2021   History of IBS 04/29/2021   Pseudotumor cerebri 04/29/2021   PCOS (polycystic ovarian syndrome) 04/29/2021   History  of depression 04/29/2021   History of idiopathic intracranial hypertension 10/29/2020   Chronic pansinusitis 09/08/2019   Deviated septum 09/08/2019   Nasal turbinate hypertrophy 09/08/2019   Seasonal allergic rhinitis 09/08/2019   Tachycardia, unspecified    Hypertension    Chronic GERD    Major depressive disorder     Migraine    Severe obesity (BMI >= 40) (HCC) 06/03/2018   Irritable bowel syndrome with both constipation and diarrhea 01/04/2018   Major depressive disorder, recurrent, mild 06/15/2017   History of migraine 03/20/2016   Migraine without aura and without status migrainosus, not intractable 03/20/2016   Post-traumatic stress disorder, unspecified 07/23/2015   Obstructive sleep apnea 02/21/2013   Generalized anxiety disorder 11/05/2012   PP care - s/p 1C/S 1/19 06/20/2011   Past Medical History:  Diagnosis Date   Anxiety    celexa  daily   Arthritis    hands/wrist - no meds   Chronic GERD    Depression    celexa  daily   Dysrhythmia    hx irregular heart beats - no current prob, stress related.   Eczema    GERD (gastroesophageal reflux disease)    tums prn   Headache(784.0)    OTC prn   History of seasonal allergies    benadryl  prn   Hypertension    IBS (irritable bowel syndrome)    IIH (idiopathic intracranial hypertension)    on lasix prior to pregnancy   Major depressive disorder    Migraine    Neck pain    PONV (postoperative nausea and vomiting)    PP care - s/p 1C/S 1/19 06/20/2011   Pseudotumor cerebri    PTSD (post-traumatic stress disorder)    Sinusitis    Strep throat    Tachycardia, unspecified    Past Surgical History:  Procedure Laterality Date   CESAREAN SECTION  06/20/2011   Procedure: CESAREAN SECTION;  Surgeon: Charlie JINNY Flowers, MD;  Location: [facility];  Service: Gynecology;  Laterality: N/A;  Primary  EDD: 07/15/11/Hx of shoulder dystocia.  Primary cesarean section with delivery of baby boy at 30. Apgars 9/9.   CESAREAN SECTION     2013-2014   CHOLECYSTECTOMY  01/31/2003   CHOLECYSTECTOMY     HYSTEROTOMY  07/2013   SVD  07/27/1999   fetal demise - shoulder dystocia   WISDOM TOOTH EXTRACTION     Social History   Tobacco Use   Smoking status: Former    Current packs/day: 0.50    Average packs/day: 0.5 packs/day for 27.9 years (13.9 ttl pk-yrs)     Types: Cigarettes    Start date: 1998   Smokeless tobacco: Never  Vaping Use   Vaping status: Never Used  Substance Use Topics   Alcohol use: Yes    Comment: rarely   Drug use: Never      Medications: Outpatient Medications Prior to Visit  Medication Sig   zaleplon (SONATA) 10 MG capsule Take 10 mg by mouth at bedtime. (Patient taking differently: Take 10 mg by mouth at bedtime as needed for sleep.)   albuterol (VENTOLIN HFA) 108 (90 Base) MCG/ACT inhaler Inhale into the lungs.   ALPRAZolam (XANAX) 0.5 MG tablet Take 0.5 mg by mouth 3 (three) times daily as needed for anxiety.   atomoxetine (STRATTERA) 80 MG capsule Take 80 mg by mouth daily.   botulinum toxin Type A (BOTOX) 200 units injection Inject 155 units IM into multiple site in the face,neck and head once every 90 days (Patient not taking: Reported  on 04/12/2024)   clobetasol  (TEMOVATE ) 0.05 % external solution Apply 1 Application topically 2 (two) times daily.   cyclobenzaprine  (FLEXERIL ) 10 MG tablet Take 1 tablet (10 mg total) by mouth at bedtime as needed for muscle spasms.   ibuprofen  (ADVIL ) 200 MG tablet Take 800 mg by mouth every 6 (six) hours as needed for mild pain.   Metoprolol Succinate 25 MG CS24 Take 12.5 mg by mouth in the morning and at bedtime. Half tab in am, half tab in pm   nortriptyline (PAMELOR) 25 MG capsule Take 25 mg by mouth at bedtime.   pantoprazole (PROTONIX) 40 MG tablet Take 40 mg by mouth daily.   Rimegepant Sulfate (NURTEC) 75 MG TBDP Take 1 tablet (75 mg total) by mouth daily as needed.   Secukinumab , 300 MG Dose, (COSENTYX , 300 MG DOSE,) 150 MG/ML SOSY Inject 2 mLs (300 mg total) into the skin once a week. (Patient not taking: Reported on 01/06/2024)   No facility-administered medications prior to visit.    Allergies  Allergen Reactions   Azithromycin Nausea And Vomiting   Hydrocodone Other (See Comments)   Other Nausea And Vomiting    MRI contrast   Topamax [Topiramate] Other (See  Comments)    Kidney stones   Vicodin [Hydrocodone-Acetaminophen ] Itching    Patient Care Team: Lowella Benton CROME, [place] as PCP - General (Physician Assistant) Skeet Juliene SAUNDERS, DO as Consulting Physician (Neurology)  ROS Complete 12 point ROS performed with all pertinent positives listed in HPI     Objective  BP 131/79   Pulse (!) 104   Ht 5' 6 (1.676 m)   Wt 250 lb (113.4 kg)   LMP 11/26/2010   SpO2 99%   BMI 40.35 kg/m  BP Readings from Last 3 Encounters:  04/12/24 131/79  01/06/24 131/79  11/23/23 127/80   Wt Readings from Last 3 Encounters:  04/12/24 250 lb (113.4 kg)  01/06/24 262 lb (118.8 kg)  11/23/23 255 lb (115.7 kg)      Physical Exam Vitals and nursing note reviewed.  Constitutional:      General: She is not in acute distress.    Appearance: Normal appearance. She is obese. She is not ill-appearing, toxic-appearing or diaphoretic.  HENT:     Head: Normocephalic and atraumatic.     Right Ear: Tympanic membrane, ear canal and external ear normal. There is no impacted cerumen.     Left Ear: Tympanic membrane, ear canal and external ear normal. There is no impacted cerumen.     Nose: Nose normal.     Mouth/Throat:     Mouth: Mucous membranes are moist.     Pharynx: Oropharynx is clear. No oropharyngeal exudate or posterior oropharyngeal erythema.  Eyes:     General: No scleral icterus.       Right eye: No discharge.        Left eye: No discharge.     Extraocular Movements: Extraocular movements intact.     Pupils: Pupils are equal, round, and reactive to light.  Neck:     Thyroid : No thyroid  mass, thyromegaly or thyroid  tenderness.  Cardiovascular:     Rate and Rhythm: Normal rate and regular rhythm.     Pulses: Normal pulses.     Heart sounds: No murmur heard. Pulmonary:     Effort: Pulmonary effort is normal. No respiratory distress.     Breath sounds: Normal breath sounds. No stridor. No wheezing or rhonchi.  Abdominal:     General: Abdomen is  flat.  Bowel sounds are normal. There is no distension.     Palpations: Abdomen is soft. There is no mass.     Tenderness: There is no abdominal tenderness. There is no guarding.  Musculoskeletal:     Cervical back: Normal range of motion and neck supple. No rigidity or tenderness.     Right lower leg: No edema.     Left lower leg: No edema.       Feet:  Lymphadenopathy:     Cervical: No cervical adenopathy.  Skin:    General: Skin is warm and dry.     Coloration: Skin is not jaundiced.     Findings: No bruising, erythema or rash.  Neurological:     General: No focal deficit present.     Mental Status: She is alert and oriented to person, place, and time.     Sensory: No sensory deficit.     Motor: No weakness.     Gait: Gait normal.  Psychiatric:        Mood and Affect: Mood normal.        Behavior: Behavior normal.     Most recent depression screenings:    11/23/2023    8:57 AM  PHQ 2/9 Scores  PHQ - 2 Score 4  PHQ- 9 Score 21      Data saved with a previous flowsheet row definition    Fall Screening Falls in the past year?: 1 Number of falls in past year: 0 Was there an injury with Fall?: 0 Fall Risk Category Calculator: 1 Patient Fall Risk Level: Low Fall Risk  Fall Risk Patient at Risk for Falls Due to: Impaired balance/gait Fall risk Follow up: Falls evaluation completed  Advance Directives (For Healthcare) Does Patient Have a Medical Advance Directive?: No Would patient like information on creating a medical advance directive?: No - Patient declined    Vision/Hearing Screen: No results found.  Last CBC Lab Results  Component Value Date   WBC 8.8 11/23/2023   HGB 13.3 11/23/2023   HCT 42.5 11/23/2023   MCV 96 11/23/2023   MCH 30.1 11/23/2023   RDW 12.6 11/23/2023   PLT 356 11/23/2023   Last metabolic panel Lab Results  Component Value Date   GLUCOSE 93 11/23/2023   NA 139 11/23/2023   K 3.8 11/23/2023   CL 101 11/23/2023   CO2 21 11/23/2023    BUN 9 11/23/2023   CREATININE 1.00 11/23/2023   EGFR 71 11/23/2023   CALCIUM 8.8 11/23/2023   PROT 6.6 11/23/2023   ALBUMIN 3.8 (L) 11/23/2023   LABGLOB 2.8 11/23/2023   BILITOT 0.3 11/23/2023   ALKPHOS 97 11/23/2023   AST 12 11/23/2023   ALT 15 11/23/2023   ANIONGAP 8 07/15/2020   Last lipids Lab Results  Component Value Date   CHOL 198 11/23/2023   HDL 41 11/23/2023   LDLCALC 131 (H) 11/23/2023   TRIG 144 11/23/2023   CHOLHDL 4.8 (H) 11/23/2023   Last hemoglobin A1c No results found for: HGBA1C Last thyroid  functions Lab Results  Component Value Date   TSH 2.440 11/23/2023   FREET4 1.12 11/23/2023   Last vitamin D No results found for: 25OHVITD2, 25OHVITD3, VD25OH Last vitamin B12 and Folate Lab Results  Component Value Date   VITAMINB12 256 04/09/2020      No results found for any visits on 04/12/24.    Assessment & Plan   Annual wellness visit done today including the all of the following: Reviewed patient's Family  and Medical History Reviewed and updated list of patient's medical providers Assessment of cognitive impairment was done Assessed patient's functional ability Established a written schedule for health screening services Health Risk Assessent Completed and Reviewed   Immunization History  Administered Date(s) Administered   Influenza, Quadrivalent, Recombinant, Inj, Pf 02/24/2019   Influenza, Seasonal, Injecte, Preservative Fre 02/10/2011, 04/12/2024   Influenza,inj,Quad PF,6+ Mos 02/22/2019   Influenza,inj,Quad PF,6-35 Mos 02/22/2019   PFIZER(Purple Top)SARS-COV-2 Vaccination 01/05/2020, 01/26/2020   PNEUMOCOCCAL CONJUGATE-20 04/26/2023   Td 05/01/1998   Tdap 06/01/2008, 06/03/2018    Health Maintenance  Topic Date Due   COVID-19 Vaccine (3 - Pfizer risk series) 02/23/2020   Hepatitis B Vaccines 19-59 Average Risk (1 of 3 - 19+ 3-dose series) 04/12/2025 (Originally 10/02/1998)   Mammogram  07/10/2024   DTaP/Tdap/Td (4 - Td or  Tdap) 06/03/2028   Pneumococcal Vaccine  Completed   Influenza Vaccine  Completed   Hepatitis C Screening  Completed   HIV Screening  Completed   Meningococcal B Vaccine  Aged Out   HPV VACCINES  Discontinued     Discussed health benefits of physical activity, and encouraged her to engage in regular exercise appropriate for her age and condition.    Problem List Items Addressed This Visit     Major depressive disorder   Relevant Medications   busPIRone (BUSPAR) 5 MG tablet   Other Relevant Orders   Nortriptyline (Aventyl), Serum   Other Visit Diagnoses       Routine adult health maintenance    -  Primary   Relevant Orders   CBC with Differential/Platelet   Hemoglobin A1c   TSH   Lipid panel   Comprehensive metabolic panel with GFR     Immunization due       Relevant Orders   Flu vaccine trivalent PF, 6mos and older(Flulaval,Afluria,Fluarix,Fluzone) (Completed)     Thyroid  nodule       Relevant Orders   US  THYROID      Mixed hyperlipidemia       Relevant Orders   Lipid panel     Long-term use of high-risk medication       Relevant Orders   Nortriptyline (Aventyl), Serum     Chronic heel pain, right       Relevant Orders   DG Ankle Complete Right (Completed)      Assessment and Plan    Adult Wellness Visit Annual wellness visit conducted. - Ordered routine standard labs including cholesterol. - flu vaccine updated today  Depression with poor response to current therapy. Depression poorly managed on nortriptyline. History of limited success with multiple antidepressants. Passive suicidal ideation without active plan. Discussed genetic testing for medication guidance and ECT as options. Emphasized emergency care for active suicidal thoughts. - Ordered nortriptyline level. Would love to try pristique, however pt did not tolerate effexor - Ordered genetic test for medication metabolism ([employer]) - Prescribed Buspar for BID use - Discussed ECT as a potential  treatment option. - Advised to seek emergency care if suicidal thoughts become active.  Right heel pain with history of calcaneal spur and plantar fasciitis Chronic right heel pain, possibly due to bone spur, tendinitis, or bursitis. No recent trauma. - Ordered x-ray of right heel. - Recommended topical Voltaren for pain management.  Nontoxic single thyroid  nodule Follow-up requested for thyroid  nodule. - Ordered thyroid  ultrasound.  Mixed hyperlipidemia Cholesterol levels due for repeat testing. - Ordered repeat cholesterol test.        Return in about 3 weeks (  around 05/03/2024).     Benton LITTIE Gave, PA

## 2024-04-13 ENCOUNTER — Encounter: Payer: Self-pay | Admitting: Urgent Care

## 2024-04-15 LAB — CBC WITH DIFFERENTIAL/PLATELET
Basophils Absolute: 0 x10E3/uL (ref 0.0–0.2)
Basos: 0 %
EOS (ABSOLUTE): 0.1 x10E3/uL (ref 0.0–0.4)
Eos: 1 %
Hematocrit: 44.5 % (ref 34.0–46.6)
Hemoglobin: 14.5 g/dL (ref 11.1–15.9)
Immature Grans (Abs): 0 x10E3/uL (ref 0.0–0.1)
Immature Granulocytes: 0 %
Lymphocytes Absolute: 2.7 x10E3/uL (ref 0.7–3.1)
Lymphs: 29 %
MCH: 30.4 pg (ref 26.6–33.0)
MCHC: 32.6 g/dL (ref 31.5–35.7)
MCV: 93 fL (ref 79–97)
Monocytes Absolute: 0.9 x10E3/uL (ref 0.1–0.9)
Monocytes: 10 %
Neutrophils Absolute: 5.4 x10E3/uL (ref 1.4–7.0)
Neutrophils: 60 %
Platelets: 352 x10E3/uL (ref 150–450)
RBC: 4.77 x10E6/uL (ref 3.77–5.28)
RDW: 12.7 % (ref 11.7–15.4)
WBC: 9.2 x10E3/uL (ref 3.4–10.8)

## 2024-04-15 LAB — COMPREHENSIVE METABOLIC PANEL WITH GFR
ALT: 13 IU/L (ref 0–32)
AST: 13 IU/L (ref 0–40)
Albumin: 4 g/dL (ref 3.9–4.9)
Alkaline Phosphatase: 85 IU/L (ref 41–116)
BUN/Creatinine Ratio: 9 (ref 9–23)
BUN: 9 mg/dL (ref 6–24)
Bilirubin Total: 0.3 mg/dL (ref 0.0–1.2)
CO2: 20 mmol/L (ref 20–29)
Calcium: 9.4 mg/dL (ref 8.7–10.2)
Chloride: 103 mmol/L (ref 96–106)
Creatinine, Ser: 1.03 mg/dL — ABNORMAL HIGH (ref 0.57–1.00)
Globulin, Total: 3 g/dL (ref 1.5–4.5)
Glucose: 109 mg/dL — ABNORMAL HIGH (ref 70–99)
Potassium: 4.1 mmol/L (ref 3.5–5.2)
Sodium: 144 mmol/L (ref 134–144)
Total Protein: 7 g/dL (ref 6.0–8.5)
eGFR: 69 mL/min/1.73 (ref >59)

## 2024-04-15 LAB — LIPID PANEL
Chol/HDL Ratio: 5 ratio — ABNORMAL HIGH (ref 0.0–4.4)
Cholesterol, Total: 253 mg/dL — ABNORMAL HIGH (ref 100–199)
HDL: 51 mg/dL (ref >39)
LDL Chol Calc (NIH): 180 mg/dL — ABNORMAL HIGH (ref 0–99)
Triglycerides: 121 mg/dL (ref 0–149)
VLDL Cholesterol Cal: 22 mg/dL (ref 5–40)

## 2024-04-15 LAB — HEMOGLOBIN A1C
Est. average glucose Bld gHb Est-mCnc: 123 mg/dL
Hgb A1c MFr Bld: 5.9 % — ABNORMAL HIGH (ref 4.8–5.6)

## 2024-04-15 LAB — TSH: TSH: 1.93 u[IU]/mL (ref 0.450–4.500)

## 2024-04-15 LAB — NORTRIPTYLINE (AVENTYL), SERUM: Nortriptyline (Aventyl), Serum: <20 ng/mL — ABNORMAL LOW (ref 50–150)

## 2024-04-18 DIAGNOSIS — F411 Generalized anxiety disorder: Secondary | ICD-10-CM | POA: Diagnosis not present

## 2024-04-18 DIAGNOSIS — F4312 Post-traumatic stress disorder, chronic: Secondary | ICD-10-CM | POA: Diagnosis not present

## 2024-04-18 DIAGNOSIS — F332 Major depressive disorder, recurrent severe without psychotic features: Secondary | ICD-10-CM | POA: Diagnosis not present

## 2024-04-24 DIAGNOSIS — F411 Generalized anxiety disorder: Secondary | ICD-10-CM | POA: Diagnosis not present

## 2024-04-24 DIAGNOSIS — F4312 Post-traumatic stress disorder, chronic: Secondary | ICD-10-CM | POA: Diagnosis not present

## 2024-04-24 DIAGNOSIS — F332 Major depressive disorder, recurrent severe without psychotic features: Secondary | ICD-10-CM | POA: Diagnosis not present

## 2024-05-02 ENCOUNTER — Ambulatory Visit: Admitting: Urgent Care

## 2024-05-03 MED ORDER — DESVENLAFAXINE SUCCINATE ER 50 MG PO TB24: 50.0000 mg | ORAL_TABLET | Freq: Every day | ORAL | 0 refills | Status: DC

## 2024-05-03 MED ORDER — DESVENLAFAXINE SUCCINATE ER 25 MG PO TB24: 25.0000 mg | ORAL_TABLET | Freq: Every day | ORAL | 0 refills | Status: DC

## 2024-05-08 DIAGNOSIS — F332 Major depressive disorder, recurrent severe without psychotic features: Secondary | ICD-10-CM | POA: Diagnosis not present

## 2024-05-08 DIAGNOSIS — F411 Generalized anxiety disorder: Secondary | ICD-10-CM | POA: Diagnosis not present

## 2024-05-08 DIAGNOSIS — F4312 Post-traumatic stress disorder, chronic: Secondary | ICD-10-CM | POA: Diagnosis not present

## 2024-05-12 ENCOUNTER — Encounter: Payer: Self-pay | Admitting: Neurology

## 2024-05-12 ENCOUNTER — Ambulatory Visit: Admitting: Neurology

## 2024-05-16 DIAGNOSIS — F411 Generalized anxiety disorder: Secondary | ICD-10-CM | POA: Diagnosis not present

## 2024-05-16 DIAGNOSIS — F332 Major depressive disorder, recurrent severe without psychotic features: Secondary | ICD-10-CM | POA: Diagnosis not present

## 2024-05-16 DIAGNOSIS — F4312 Post-traumatic stress disorder, chronic: Secondary | ICD-10-CM | POA: Diagnosis not present

## 2024-05-19 ENCOUNTER — Encounter: Payer: Self-pay | Admitting: Urgent Care

## 2024-05-19 ENCOUNTER — Other Ambulatory Visit: Payer: Self-pay | Admitting: Urgent Care

## 2024-05-19 DIAGNOSIS — F332 Major depressive disorder, recurrent severe without psychotic features: Secondary | ICD-10-CM

## 2024-05-19 DIAGNOSIS — F330 Major depressive disorder, recurrent, mild: Secondary | ICD-10-CM

## 2024-05-29 ENCOUNTER — Telehealth: Payer: Self-pay

## 2024-05-29 ENCOUNTER — Ambulatory Visit: Payer: Self-pay

## 2024-05-29 NOTE — Telephone Encounter (Signed)
 Attempted call to patient to inform her that Benton Gave, [place] is out of the office this week for vacation but call went to voicemail. Left a voicemail message requesting a return call. I will also send a Mychart message to the patient.

## 2024-05-29 NOTE — Telephone Encounter (Signed)
 Copied from CRM #[id number]. Topic: General - Other >> May 29, 2024  2:34 PM Dominique A wrote: Reason for CRM: Patient called in returning [facility] phone call. I did relay the message that Benton Gave is out of the office on vacation and asked if she would like to schedule with another provider. Patient declined and stated that she will go to the Urgent Care.

## 2024-05-29 NOTE — Telephone Encounter (Signed)
 FYI Only or Action Required?: Action required by provider: request for appointment. Only willing to see PCP.   Patient was last seen in primary care on 04/12/2024 by Lowella Benton CROME, PA.  Called Nurse Triage reporting Cough.  Symptoms began several days ago.  Interventions attempted: Nothing.  Symptoms are: unchanged.  Triage Disposition: Home Care  Patient/caregiver understands and will follow disposition?: No, wishes to speak with PCP  Reason for Triage: Yellow mucus, facial pain, headache, beginning of a cough ( sinus infection )    Reason for Disposition  Cough with cold symptoms (e.g., runny nose, postnasal drip, throat clearing)  Answer Assessment - Initial Assessment Questions 1. ONSET: When did the cough begin?      Couple days 2. SEVERITY: How bad is the cough today?      Mild today 3. SPUTUM: Describe the color of your sputum (e.g., none, dry cough; clear, white, yellow, green)     yellow 4. HEMOPTYSIS: Are you coughing up any blood? If Yes, ask: How much? (e.g., flecks, streaks, tablespoons, etc.)     denies 5. DIFFICULTY BREATHING: Are you having difficulty breathing? If Yes, ask: How bad is it? (e.g., mild, moderate, severe)      denies 6. FEVER: Do you have a fever? If Yes, ask: What is your temperature, how was it measured, and when did it start?     denies 10. OTHER SYMPTOMS: Do you have any other symptoms? (e.g., runny nose, wheezing, chest pain)       Facial pain, HA 11. PREGNANCY: Is there any chance you are pregnant? When was your last menstrual period?       denies 58. TRAVEL: Have you traveled out of the country in the last month? (e.g., travel history, exposures)       Denies  Pt offered appts with other providers in pcp clinic, pt refused, pt states that she only trusts PCP. Pt advised that she could also use UC, pt declines. Pt requesting appt with PCP.  Protocols used: Cough - Acute Productive-A-AH

## 2024-05-30 NOTE — Telephone Encounter (Signed)
 Forwarding message to Dr. Alvan covering Jessica crain,PA Spoke with patient. She did go to urgent care today . Was tested -but states was negative for all testing ( did not specify which testing was done) -she states she was told that this was likely viral and exacerbated her Asthma. She did have a sinus infection and was started on prednisone and augmentin .

## 2024-06-09 ENCOUNTER — Encounter: Payer: Self-pay | Admitting: Neurology

## 2024-06-09 ENCOUNTER — Ambulatory Visit: Admitting: Neurology

## 2024-06-09 DIAGNOSIS — G43709 Chronic migraine without aura, not intractable, without status migrainosus: Secondary | ICD-10-CM

## 2024-06-09 MED ORDER — ONABOTULINUMTOXINA 100 UNITS IJ SOLR
200.0000 [IU] | Freq: Once | INTRAMUSCULAR | Status: AC
  Administered 2024-06-09: 155 [IU] via INTRAMUSCULAR

## 2024-06-09 NOTE — Progress Notes (Signed)

## 2024-06-27 ENCOUNTER — Encounter: Payer: Self-pay | Admitting: Urgent Care

## 2024-06-27 NOTE — Telephone Encounter (Signed)
 Left message for patient to return call to schedule a virtual visit.

## 2024-06-28 ENCOUNTER — Encounter: Payer: Self-pay | Admitting: Urgent Care

## 2024-06-28 ENCOUNTER — Telehealth: Admitting: Urgent Care

## 2024-06-28 DIAGNOSIS — I10 Essential (primary) hypertension: Secondary | ICD-10-CM

## 2024-06-28 DIAGNOSIS — F332 Major depressive disorder, recurrent severe without psychotic features: Secondary | ICD-10-CM

## 2024-06-28 DIAGNOSIS — F339 Major depressive disorder, recurrent, unspecified: Secondary | ICD-10-CM | POA: Diagnosis not present

## 2024-06-28 DIAGNOSIS — F411 Generalized anxiety disorder: Secondary | ICD-10-CM | POA: Diagnosis not present

## 2024-06-28 DIAGNOSIS — F330 Major depressive disorder, recurrent, mild: Secondary | ICD-10-CM

## 2024-06-28 MED ORDER — DESVENLAFAXINE SUCCINATE ER 50 MG PO TB24: 50.0000 mg | ORAL_TABLET | Freq: Every day | ORAL | 0 refills | Status: AC

## 2024-06-28 MED ORDER — ALPRAZOLAM 0.5 MG PO TABS: 0.5000 mg | ORAL_TABLET | Freq: Three times a day (TID) | ORAL | 0 refills | Status: AC | PRN

## 2024-06-28 MED ORDER — BUSPIRONE HCL 10 MG PO TABS: ORAL_TABLET | ORAL | 2 refills | Status: AC

## 2024-06-28 NOTE — Assessment & Plan Note (Addendum)
" °  °  Assessment and Plan    Major depressive disorder, recurrent Currently on Pristiq  50 mg. Therapy is beneficial. Stress contributes. - Encouraged continuation of therapy. - Recommended self-care activities such as coloring or art. - discussed ways to add positive reassurance daily  Generalized anxiety disorder Buspirone  5 mg once daily is subtherapeutic. Increased alprazolam  use indicates need for higher buspirone  dosage. Stress contributes. - Increased buspirone  to 10 mg twice daily with tapering: 5 mg twice daily for 5 days, then 10 mg in the morning and 5 mg at night for 5 days, then 10 mg twice daily. - Called in alprazolam  prescription to pharmacy. Cannot fill until 07/07/2024 - Encouraged self-care activities and positive affirmations.  Hypertension Currently on metoprolol 50 mg twice daily. Stress contributes. - Continue metoprolol 50 mg twice daily.       "

## 2024-06-28 NOTE — Patient Instructions (Addendum)
 Please increase your buspirone  from 5mg  daily to the following taper schedule: Start 1/2 tab twice daily x 5 days, then 1 tab in AM, 1/2 tab in PM x 5 days, then continue with 1 tab twice daily.   Use xanax  as needed, trying to cut back on frequency of use when possible.  Use positive affirmations and daily reminders to improve mood.  Continue therapy. Follow up in one month, sooner as needed.

## 2024-06-28 NOTE — Progress Notes (Signed)
 "  Virtual Visit via Video Note  I connected with [name] on 06/28/24 at  9:40 AM EST by a video enabled telemedicine application and verified that I am speaking with the correct person using two identifiers.  Patient Location: Other:  work Dispensing Optician: Office/Clinic  I discussed the limitations, risks, security, and privacy concerns of performing an evaluation and management service by video and the availability of in person appointments. I also discussed with the patient that there may be a patient responsible charge related to this service. The patient expressed understanding and agreed to proceed.   Subjective  PCP: Lowella Benton CROME, PA  Chief Complaint  Patient presents with   Medical Management of Chronic Issues    HPI:   Discussed the use of AI scribe software for clinical note transcription with the patient, who gave verbal consent to proceed.  History of Present Illness   [name] is a 44 year old female who presents for medication management and follow-up on anxiety treatment.  She is currently taking alprazolam  0.5 mg up to three times daily, with her last prescription filled on June 07, 2024, for 120 tablets. She anticipates needing a refill by July 08, 2024, and uses the medication as needed. Currently has a lot of life stressors including work and a current divorce.  She is on Pristiq , with a current dose of 50 mg, but does not notice a significant difference in her depression. She started buspirone  which has not been effective in managing her anxiety. Of note, she never started taking BID and has only been doing one 5mg  tab daily.  Previously, she was on nortriptyline , which has been discontinued. She is not taking Sonata, and Cosentyx  was never filled. She is awaiting a new psychiatrist (referral already placed, pt just needs to schedule) and is currently in weekly therapy which patient finds effective.  She continues to take metoprolol  succinate 50mg  twice daily. She reports that her stress levels may be contributing to her symptoms.        ROS: Per HPI. All other pertinent systems are negative.  Current Medications[1]    Objective  Vital signs not able to be obtained due to this being a virtual visit.  Physical Exam Constitutional:      General: She is not in acute distress.    Appearance: Normal appearance. She is not ill-appearing, toxic-appearing or diaphoretic.  Cardiovascular:     Rate and Rhythm: Normal rate.  Pulmonary:     Effort: Pulmonary effort is normal. No respiratory distress.     Comments: Pt speaking in complete sentences without breathlessness Neurological:     Mental Status: She is alert.      Assessment & Plan Severe episode of recurrent major depressive disorder, without psychotic features (HCC)  Orders:   ALPRAZolam  (XANAX ) 0.5 MG tablet; Take 1 tablet (0.5 mg total) by mouth 3 (three) times daily as needed for anxiety.   desvenlafaxine  (PRISTIQ ) 50 MG 24 hr tablet; Take 1 tablet (50 mg total) by mouth daily.   busPIRone  (BUSPAR ) 10 MG tablet; Start 1/2 tab twice daily x 5 days, then 1 tab in AM, 1/2 tab in PM x 5 days, then continue with 1 tab twice daily.  Major depressive disorder, recurrent, mild    Assessment and Plan    Major depressive disorder, recurrent Currently on Pristiq  50 mg. Therapy is beneficial. Stress contributes. - Encouraged continuation of therapy. - Recommended self-care activities such as coloring or art. -  discussed ways to add positive reassurance daily  Generalized anxiety disorder Buspirone  5 mg once daily is subtherapeutic. Increased alprazolam  use indicates need for higher buspirone  dosage. Stress contributes. - Increased buspirone  to 10 mg twice daily with tapering: 5 mg twice daily for 5 days, then 10 mg in the morning and 5 mg at night for 5 days, then 10 mg twice daily. - Called in alprazolam  prescription to pharmacy. Cannot fill until  07/07/2024 - Encouraged self-care activities and positive affirmations.  Hypertension Currently on metoprolol 50 mg twice daily. Stress contributes. - Continue metoprolol 50 mg twice daily.        No follow-ups on file.   I discussed the assessment and treatment plan with the patient. The patient was provided an opportunity to ask questions, and all were answered. The patient agreed with the plan and demonstrated an understanding of the instructions.   The patient was advised to call back or seek an in-person evaluation if the symptoms worsen or if the condition fails to improve as anticipated.  The above assessment and management plan was discussed with the patient. The patient verbalized understanding of and has agreed to the management plan.   Benton LITTIE Gave, PA     [1]  Current Outpatient Medications:    albuterol (VENTOLIN HFA) 108 (90 Base) MCG/ACT inhaler, Inhale into the lungs., Disp: , Rfl:    atomoxetine (STRATTERA) 80 MG capsule, Take 80 mg by mouth daily., Disp: , Rfl:    botulinum toxin Type A  (BOTOX ) 200 units injection, Inject 155 units IM into multiple site in the face,neck and head once every 90 days, Disp: 1 each, Rfl: 4   clobetasol  (TEMOVATE ) 0.05 % external solution, Apply 1 Application topically 2 (two) times daily., Disp: 50 mL, Rfl: 2   cyclobenzaprine  (FLEXERIL ) 10 MG tablet, Take 1 tablet (10 mg total) by mouth at bedtime as needed for muscle spasms., Disp: 30 tablet, Rfl: 5   diclofenac  (VOLTAREN ) 75 MG EC tablet, Take 1 tablet (75 mg total) by mouth 2 (two) times daily with a meal., Disp: 30 tablet, Rfl: 0   ibuprofen  (ADVIL ) 200 MG tablet, Take 800 mg by mouth every 6 (six) hours as needed for mild pain., Disp: , Rfl:    Metoprolol Succinate 50 MG CS24, Take 50 mg by mouth in the morning and at bedtime., Disp: , Rfl:    pantoprazole (PROTONIX) 40 MG tablet, Take 40 mg by mouth daily., Disp: , Rfl:    Rimegepant Sulfate (NURTEC) 75 MG TBDP, Take 1 tablet  (75 mg total) by mouth daily as needed., Disp: 16 tablet, Rfl: 11   ALPRAZolam  (XANAX ) 0.5 MG tablet, Take 1 tablet (0.5 mg total) by mouth 3 (three) times daily as needed for anxiety., Disp: 120 tablet, Rfl: 0   busPIRone  (BUSPAR ) 10 MG tablet, Start 1/2 tab twice daily x 5 days, then 1 tab in AM, 1/2 tab in PM x 5 days, then continue with 1 tab twice daily., Disp: 60 tablet, Rfl: 2   desvenlafaxine  (PRISTIQ ) 50 MG 24 hr tablet, Take 1 tablet (50 mg total) by mouth daily., Disp: 90 tablet, Rfl: 0  "

## 2024-06-28 NOTE — Assessment & Plan Note (Addendum)
" °  Orders:   ALPRAZolam  (XANAX ) 0.5 MG tablet; Take 1 tablet (0.5 mg total) by mouth 3 (three) times daily as needed for anxiety.   desvenlafaxine  (PRISTIQ ) 50 MG 24 hr tablet; Take 1 tablet (50 mg total) by mouth daily.   busPIRone  (BUSPAR ) 10 MG tablet; Start 1/2 tab twice daily x 5 days, then 1 tab in AM, 1/2 tab in PM x 5 days, then continue with 1 tab twice daily.  "

## 2024-08-10 ENCOUNTER — Ambulatory Visit: Admitting: Neurology

## 2024-08-11 ENCOUNTER — Ambulatory Visit: Admitting: Neurology

## 2024-08-23 ENCOUNTER — Ambulatory Visit: Admitting: Neurology

## 2024-09-08 ENCOUNTER — Ambulatory Visit: Payer: Self-pay | Admitting: Neurology

## 2024-11-28 ENCOUNTER — Ambulatory Visit: Admitting: Neurology
